# Patient Record
Sex: Female | Born: 1955 | Race: White | Hispanic: No | Smoking: Never smoker
Health system: Southern US, Community
[De-identification: ages and names within clinical notes are randomized; demographics above are authoritative.]

## PROBLEM LIST (undated history)

## (undated) DIAGNOSIS — E663 Overweight: Secondary | ICD-10-CM

## (undated) DIAGNOSIS — M79673 Pain in unspecified foot: Secondary | ICD-10-CM

## (undated) DIAGNOSIS — F329 Major depressive disorder, single episode, unspecified: Secondary | ICD-10-CM

## (undated) DIAGNOSIS — Z86718 Personal history of other venous thrombosis and embolism: Secondary | ICD-10-CM

## (undated) DIAGNOSIS — R32 Unspecified urinary incontinence: Secondary | ICD-10-CM

## (undated) DIAGNOSIS — T7840XA Allergy, unspecified, initial encounter: Secondary | ICD-10-CM

## (undated) DIAGNOSIS — K219 Gastro-esophageal reflux disease without esophagitis: Secondary | ICD-10-CM

## (undated) DIAGNOSIS — K5790 Diverticulosis of intestine, part unspecified, without perforation or abscess without bleeding: Secondary | ICD-10-CM

## (undated) DIAGNOSIS — G4730 Sleep apnea, unspecified: Secondary | ICD-10-CM

## (undated) DIAGNOSIS — M2550 Pain in unspecified joint: Secondary | ICD-10-CM

## (undated) DIAGNOSIS — M549 Dorsalgia, unspecified: Secondary | ICD-10-CM

## (undated) DIAGNOSIS — D126 Benign neoplasm of colon, unspecified: Secondary | ICD-10-CM

## (undated) DIAGNOSIS — F32A Depression, unspecified: Secondary | ICD-10-CM

## (undated) DIAGNOSIS — I10 Essential (primary) hypertension: Secondary | ICD-10-CM

## (undated) DIAGNOSIS — M1990 Unspecified osteoarthritis, unspecified site: Secondary | ICD-10-CM

## (undated) HISTORY — DX: Personal history of other venous thrombosis and embolism: Z86.718

## (undated) HISTORY — PX: TUBAL LIGATION: SHX77

## (undated) HISTORY — PX: OTHER SURGICAL HISTORY: SHX169

## (undated) HISTORY — DX: Overweight: E66.3

## (undated) HISTORY — PX: BREAST REDUCTION SURGERY: SHX8

## (undated) HISTORY — DX: Benign neoplasm of colon, unspecified: D12.6

## (undated) HISTORY — DX: Depression, unspecified: F32.A

## (undated) HISTORY — DX: Unspecified osteoarthritis, unspecified site: M19.90

## (undated) HISTORY — PX: KNEE SURGERY: SHX244

## (undated) HISTORY — DX: Pain in unspecified joint: M25.50

## (undated) HISTORY — DX: Allergy, unspecified, initial encounter: T78.40XA

## (undated) HISTORY — DX: Unspecified urinary incontinence: R32

## (undated) HISTORY — DX: Diverticulosis of intestine, part unspecified, without perforation or abscess without bleeding: K57.90

## (undated) HISTORY — PX: COLONOSCOPY: SHX174

## (undated) HISTORY — DX: Gastro-esophageal reflux disease without esophagitis: K21.9

## (undated) HISTORY — DX: Dorsalgia, unspecified: M54.9

## (undated) HISTORY — DX: Pain in unspecified foot: M79.673

## (undated) HISTORY — PX: WISDOM TOOTH EXTRACTION: SHX21

## (undated) HISTORY — DX: Essential (primary) hypertension: I10

## (undated) HISTORY — PX: REDUCTION MAMMAPLASTY: SUR839

## (undated) HISTORY — PX: GASTRIC BYPASS: SHX52

## (undated) HISTORY — DX: Major depressive disorder, single episode, unspecified: F32.9

---
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
## 2007-10-16 DIAGNOSIS — Z9884 Bariatric surgery status: Secondary | ICD-10-CM

## 2007-10-16 HISTORY — DX: Bariatric surgery status: Z98.84

## 2009-10-15 HISTORY — PX: ABDOMINAL HYSTERECTOMY: SHX81

## 2015-08-09 LAB — HM DEXA SCAN

## 2015-10-16 LAB — HM PAP SMEAR

## 2016-05-03 LAB — HM MAMMOGRAPHY

## 2016-12-17 LAB — HM COLONOSCOPY

## 2016-12-27 LAB — BASIC METABOLIC PANEL
BUN: 15 (ref 4–21)
Creatinine: 0.6 (ref <=1.1)
Glucose: 63
POTASSIUM: 4.3 (ref 3.4–5.3)
SODIUM: 143 (ref 137–147)

## 2016-12-27 LAB — CBC AND DIFFERENTIAL
HEMATOCRIT: 41 (ref 36–46)
HEMOGLOBIN: 13.8 (ref 12.0–16.0)
Neutrophils Absolute: 4
PLATELETS: 266 (ref 150–399)
WBC: 5.5

## 2016-12-27 LAB — VITAMIN D 25 HYDROXY (VIT D DEFICIENCY, FRACTURES): VIT D 25 HYDROXY: 18

## 2016-12-27 LAB — TSH: TSH: 1.53 (ref <=5.90)

## 2016-12-27 LAB — LIPID PANEL
Cholesterol: 193 (ref 0–200)
HDL: 86 — AB (ref 35–70)
LDL CALC: 91
TRIGLYCERIDES: 81 (ref 40–160)

## 2017-01-28 LAB — VITAMIN B12: Vitamin B-12: 450

## 2017-03-20 ENCOUNTER — Encounter: Payer: Self-pay | Admitting: Family Medicine

## 2017-03-25 ENCOUNTER — Ambulatory Visit (INDEPENDENT_AMBULATORY_CARE_PROVIDER_SITE_OTHER): Payer: 59 | Admitting: Family Medicine

## 2017-03-25 ENCOUNTER — Encounter: Payer: Self-pay | Admitting: Family Medicine

## 2017-03-25 VITALS — BP 118/82 | HR 74 | Temp 97.7°F | Ht 59.0 in | Wt 140.0 lb

## 2017-03-25 DIAGNOSIS — Z7689 Persons encountering health services in other specified circumstances: Secondary | ICD-10-CM

## 2017-03-25 DIAGNOSIS — G8929 Other chronic pain: Secondary | ICD-10-CM | POA: Diagnosis not present

## 2017-03-25 DIAGNOSIS — M545 Low back pain: Secondary | ICD-10-CM

## 2017-03-25 DIAGNOSIS — M47816 Spondylosis without myelopathy or radiculopathy, lumbar region: Secondary | ICD-10-CM | POA: Insufficient documentation

## 2017-03-25 NOTE — Patient Instructions (Addendum)
It was a pleasure to meet you today. Please schedule a physical at your convenience.  Also, information for [facility] has been provided for your review.    WE NOW OFFER   Shelly Reynolds[facility] FAST TRACK!!!  SAME DAY Appointments for ACUTE CARE  Such as: Sprains, Injuries, cuts, abrasions, rashes, muscle pain, joint pain, back pain Colds, flu, sore throats, headache, allergies, cough, fever  Ear pain, sinus and eye infections Abdominal pain, nausea, vomiting, diarrhea, upset stomach Animal/insect bites  3 Easy Ways to Schedule: Walk-In Scheduling Call in scheduling Mychart Sign-up: [email or web address]

## 2017-03-25 NOTE — Progress Notes (Signed)
Patient ID: Shelly Reynolds, female   DOB: [birth year], 61 y.o.   MRN: [id number]  Patient presents to clinic today to establish care. She has recently moved here 2 weeks ago from [place]: Low back pain where she has been evaluated and treated  by PT in [place].  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  Pain has improved since she is no longer in this job. BACK PAIN Location: Low back pain Quality: Aching  Worse with: Lifting      Better with: Rest      Radiation: No Trauma: No Best sitting/standing/leaning forward: No   Red Flags Fecal/urinary incontinence: No Numbness/Weakness: No  Fever/chills/sweats: No Night pain:  No Unexplained weight loss: No No relief with bedrest:  No h/o cancer/immunosuppression:  No IV drug use:  No PMH of osteoporosis or chronic steroid use: No  History of gastric bypass and treatment with weight loss medications. She is completing Saxenda [place] for weight loss that has provided great benefit for her. She is interested in adhering to lifestyle modifications and will follow up for evaluation of effectiveness of Saxenda.  She reports approximately 14 lb. Weight loss in the past 6 months. She denies adverse effects of this medication.   Health Maintenance: Dental --Every 6 months Vision --Glasses; every year, UTD Immunizations --No influenza, Needs Tdap; she will consider this with her physical along with  [facility] Colonoscopy -- 2018; no abnormal findings; 10 year follow up Mammogram -- Yearly; she believes this is due soon; will request records PAP --Hysterectomy; Last exam/pap July 2017 Bone Density -- Prior to gastric bypass; no concerns   Past Medical History:  Diagnosis Date  . Arthritis   . Depression   . Eating disorder   . GERD (gastroesophageal reflux disease)   . Hypertension   . Urine incontinence   . UTI (urinary  tract infection)     Past Surgical History:  Procedure Laterality Date  . ABDOMINAL HYSTERECTOMY  2011  . BLADDER REPAIR    . BREAST REDUCTION SURGERY    . GASTRIC BYPASS    . TONSILLECTOMY AND ADENOIDECTOMY Bilateral 1962  . TUBAL LIGATION    . TUMMY TUCK N/A     No current outpatient prescriptions on file prior to visit.   No current facility-administered medications on file prior to visit.     No Known Allergies  Family History  Problem Relation Age of Onset  . Arthritis Mother   . Cancer Mother   . Heart disease Mother   . Diabetes Mother   . Heart disease Father   . Hypertension Father   . Alcohol abuse Brother   . Alcohol abuse Maternal Uncle   . Alcohol abuse Maternal Grandmother     Social History   Social History  . Marital status: Married    Spouse name: N/A  . Number of children: N/A  . Years of education: N/A   Occupational History  . Not on file.   Social History Main Topics  . Smoking status: Never Smoker  . Smokeless tobacco: Never Used  . Alcohol use Yes     Comment: OCCASSIONAL  . Drug use: No  . Sexual activity: Not on file     Comment: HYSTERECTOMY   Other Topics Concern  . Not on file   Social History Narrative  . No narrative on file  Review of Systems  Constitutional: Negative for chills and fever.  Respiratory: Negative for cough, hemoptysis, sputum production, shortness of breath and wheezing.   Cardiovascular: Negative for chest pain, palpitations and leg swelling.  Gastrointestinal: Negative for abdominal pain, blood in stool, constipation, diarrhea, heartburn, melena, nausea and vomiting.  Musculoskeletal: Negative for back pain and myalgias.  Skin: Negative for rash.  Neurological: Negative for dizziness, focal weakness, weakness and headaches.  Psychiatric/Behavioral:       Denies depressed or anxious mood    BP 118/82 (BP Location: Left Arm, Patient Position: Sitting, Cuff Size: Normal)   Pulse 74   Temp 97.7  F (36.5 C) (Oral)   Ht 4\' 11"  (1.499 m)   Wt 140 lb (63.5 kg)   LMP 01/23/2006 (Exact Date)   SpO2 98%   BMI 28.28 kg/m    Past Medical History:  Diagnosis Date  . Arthritis   . Depression   . Eating disorder   . GERD (gastroesophageal reflux disease)   . Hypertension   . Urine incontinence   . UTI (urinary tract infection)      Social History   Social History  . Marital status: Married    Spouse name: N/A  . Number of children: N/A  . Years of education: N/A   Occupational History  . Not on file.   Social History Main Topics  . Smoking status: Never Smoker  . Smokeless tobacco: Never Used  . Alcohol use Yes     Comment: OCCASSIONAL  . Drug use: No  . Sexual activity: Yes     Comment: HYSTERECTOMY   Other Topics Concern  . Not on file   Social History Narrative  . No narrative on file    Past Surgical History:  Procedure Laterality Date  . ABDOMINAL HYSTERECTOMY  2011  . BLADDER REPAIR    . BREAST REDUCTION SURGERY    . GASTRIC BYPASS    . TONSILLECTOMY AND ADENOIDECTOMY Bilateral 1962  . TUBAL LIGATION    . TUMMY TUCK N/A     Family History  Problem Relation Age of Onset  . Arthritis Mother   . Cancer Mother   . Heart disease Mother   . Diabetes Mother   . Heart disease Father   . Hypertension Father   . Alcohol abuse Brother   . Alcohol abuse Maternal Uncle   . Alcohol abuse Maternal Grandmother     No Known Allergies  No current outpatient prescriptions on file prior to visit.   No current facility-administered medications on file prior to visit.     BP 118/82 (BP Location: Left Arm, Patient Position: Sitting, Cuff Size: Normal)   Pulse 74   Temp 97.7 F (36.5 C) (Oral)   Ht 4\' 11"  (1.499 m)   Wt 140 lb (63.5 kg)   LMP 01/23/2006 (Exact Date)   SpO2 98%   BMI 28.28 kg/m     Physical Exam  Constitutional: She is oriented to person, place, and time and well-developed, well-nourished, and in no distress.  Eyes: Pupils are  equal, round, and reactive to light. No scleral icterus.  Neck: Neck supple.  Cardiovascular: Normal rate, regular rhythm and intact distal pulses.   Varicosities on lower extremities bilaterally  Pulmonary/Chest: Effort normal and breath sounds normal. She has no wheezes. She has no rales.  Abdominal: Soft. Bowel sounds are normal. There is no tenderness.  Lymphadenopathy:    She has no cervical adenopathy.  Neurological: She is alert and oriented to  person, place, and time.  Skin: Skin is warm and dry. No rash noted.  Psychiatric: Mood, memory, affect and judgment normal.    Recent Results (from the past 2160 hour(s))  CBC and differential     Status: None   Collection Time: 12/27/16 12:00 AM  Result Value Ref Range   Hemoglobin 13.8 12.0 - 16.0   HCT 41 36 - 46   Neutrophils Absolute 4    Platelets 266 150 - 399   WBC 5.5   VITAMIN D 25 Hydroxy (Vit-D Deficiency, Fractures)     Status: None   Collection Time: 12/27/16 12:00 AM  Result Value Ref Range   Vit D, 25-Hydroxy 18   Basic metabolic panel     Status: None   Collection Time: 12/27/16 12:00 AM  Result Value Ref Range   Glucose 63    BUN 15 4 - 21   Creatinine 0.6 0.5 - 1.1   Potassium 4.3 3.4 - 5.3   Sodium 143 137 - 147  Lipid panel     Status: Abnormal   Collection Time: 12/27/16 12:00 AM  Result Value Ref Range   Triglycerides 81 40 - 160   Cholesterol 193 0 - 200   HDL 86 (A) 35 - 70   LDL Cholesterol 91   TSH     Status: None   Collection Time: 12/27/16 12:00 AM  Result Value Ref Range   TSH 1.53 0.41 - 5.90  Vitamin B12     Status: None   Collection Time: 01/28/17 12:00 AM  Result Value Ref Range   Vitamin B-12 450     Assessment/Plan:  1. Low back pain, unspecified back pain laterality, unspecified chronicity, with sciatica presence unspecified Stable; will consider continuation of PT if symptoms return. She will continue exercises that have been provided by PT. Follow up if symptoms return.  2.  Encounter to establish care We reviewed the PMH, PSH, FH, SH, Meds and Allergies. -We provided refills for any medications we will prescribe as needed. -We addressed current concerns per orders and patient instructions. -We have asked for records for pertinent exams, studies, vaccines and notes from previous providers. -We have advised patient to follow up per instructions below.   -Patient advised to return or notify a provider immediately if symptoms worsen or persist or new concerns arise.  Follow up in 2 to 3 months for physical, lab work, and she will also consider [facility] vaccine at that time.  Delano Metz, FNP-C

## 2017-04-01 ENCOUNTER — Telehealth: Payer: Self-pay

## 2017-04-01 ENCOUNTER — Ambulatory Visit (INDEPENDENT_AMBULATORY_CARE_PROVIDER_SITE_OTHER): Payer: 59 | Admitting: *Deleted

## 2017-04-01 ENCOUNTER — Ambulatory Visit (INDEPENDENT_AMBULATORY_CARE_PROVIDER_SITE_OTHER): Payer: 59 | Admitting: Family Medicine

## 2017-04-01 ENCOUNTER — Encounter: Payer: Self-pay | Admitting: Family Medicine

## 2017-04-01 VITALS — BP 112/78 | HR 79 | Temp 98.1°F | Wt 140.6 lb

## 2017-04-01 DIAGNOSIS — Z23 Encounter for immunization: Secondary | ICD-10-CM

## 2017-04-01 DIAGNOSIS — Z111 Encounter for screening for respiratory tuberculosis: Secondary | ICD-10-CM

## 2017-04-01 MED ORDER — TUBERCULIN PPD 5 UNIT/0.1ML ID SOLN
5.0000 [IU] | Freq: Once | INTRADERMAL | Status: AC
  Administered 2017-04-01: 5 [IU] via INTRADERMAL

## 2017-04-01 NOTE — Progress Notes (Signed)
Patient here for PPD skin test; placed skin test to left forearm ID; patient tolerated well; also administered Tdap to rt deltoid IM; patient tolerated well; patient to return in 48-72 hours for PPD test reading; understanding voiced.

## 2017-04-02 NOTE — Telephone Encounter (Signed)
Error; patient seen as nurse visit; not provider visit

## 2017-04-03 NOTE — Progress Notes (Signed)
Tuberculin skin test applied to left ventral forearm. Explained how to read the test, measuring induration not just erythema; she will come into office if test appears positive.   12mm/negative

## 2017-04-08 NOTE — Progress Notes (Signed)
Patient chart opened in error. Patient completed a nurse visit for Tb skin testing

## 2017-05-06 ENCOUNTER — Telehealth: Payer: Self-pay | Admitting: Family Medicine

## 2017-05-06 NOTE — Telephone Encounter (Signed)
Pt would like to make an appointment for fasting blood work.  May I have a order for labs?

## 2017-05-07 NOTE — Telephone Encounter (Signed)
Spoke with pt scheduled an app for 05/13/2017 at 9.15am.

## 2017-05-13 ENCOUNTER — Encounter: Payer: Self-pay | Admitting: Family Medicine

## 2017-05-13 ENCOUNTER — Ambulatory Visit (INDEPENDENT_AMBULATORY_CARE_PROVIDER_SITE_OTHER): Payer: 59 | Admitting: Family Medicine

## 2017-05-13 VITALS — BP 128/86 | HR 68 | Temp 98.5°F | Wt 145.6 lb

## 2017-05-13 DIAGNOSIS — M545 Low back pain: Secondary | ICD-10-CM | POA: Diagnosis not present

## 2017-05-13 DIAGNOSIS — M25562 Pain in left knee: Secondary | ICD-10-CM

## 2017-05-13 DIAGNOSIS — G8929 Other chronic pain: Secondary | ICD-10-CM | POA: Diagnosis not present

## 2017-05-13 MED ORDER — DICLOFENAC SODIUM 75 MG PO TBEC: 75.0000 mg | DELAYED_RELEASE_TABLET | Freq: Two times a day (BID) | ORAL | 0 refills | Status: DC

## 2017-05-13 NOTE — Patient Instructions (Signed)
It was a pleasure to see you today. Please take medication with food and this medication is for short term use.   You will be contact about your referral.  Please let us know if you have not heard back within 1 week about your referral.  Please continue PT exercises that have been previously prescribed by your provider in [place].

## 2017-05-13 NOTE — Progress Notes (Signed)
Subjective:    Patient ID: Shelly Reynolds, female    DOB: 1956-01-04, 61 y.o.   MRN: [id number]  HPI  Shelly Reynolds is a 61 year old female who presents today for evaluation of low back pain. This has been chronic in nature and she has received PT in [place]. She reports completing PT exercises at home now since she has recently moved full time to [facility]. She states that PT exercises are not improving her discomfort. 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  BACK PAIN  Location: low back pain  Quality: Aching  Worse with:  Bending   Better with: Rest       Radiation: No Trauma: No Best sitting/standing/leaning forward: No  Red Flags Fecal/urinary incontinence: No Numbness/Weakness: No Fever/chills/sweats: No Night pain:  No Unexplained weight loss: No No relief with bedrest:  No h/o cancer/immunosuppression:  No IV drug use:  No PMH of osteoporosis or chronic steroid use: No Treatment with ibuprofen 800 mg TID with food has provided moderate benefit.  History of torn meniscus of left knee with surgical repair over 5 years ago.  Surgery improved knee pain and she reports that knee pain is aggravated when back pain occurs however this is not described as radiating pain. No pain is noted in left knee at this time. No erythema, warmth, or edema noted.   History of gastric bypass and Saxenda [place] for weight loss. She has completed Saxenda and reports an increase in weight of 6 lbs. She is interested in adhering to lifestyle modifications and will further consider pharmaceutical treatments for weight loss.    Review of Systems  Constitutional: Negative for chills, fatigue and fever.  Respiratory: Negative for cough, shortness of breath and wheezing.   Cardiovascular: Negative for chest pain and palpitations.  Gastrointestinal: Negative for abdominal pain,  diarrhea, nausea and vomiting.  Genitourinary: Negative for dysuria, flank pain, frequency and urgency.  Musculoskeletal:       Low back pain and left knee pain  Skin: Negative for rash.  Neurological: Negative for dizziness, weakness, light-headedness, numbness and headaches.   Past Medical History:  Diagnosis Date  . Arthritis   . Depression   . Eating disorder   . GERD (gastroesophageal reflux disease)   . Hypertension   . Urine incontinence   . UTI (urinary tract infection)      Social History   Social History  . Marital status: Married    Spouse name: N/A  . Number of children: N/A  . Years of education: N/A   Occupational History  . Not on file.   Social History Main Topics  . Smoking status: Never Smoker  . Smokeless tobacco: Never Used  . Alcohol use Yes     Comment: OCCASSIONAL  . Drug use: No  . Sexual activity: Yes     Comment: HYSTERECTOMY   Other Topics Concern  . Not on file   Social History Narrative  . No narrative on file    Past Surgical History:  Procedure Laterality Date  . ABDOMINAL HYSTERECTOMY  2011  . BLADDER REPAIR    . BREAST REDUCTION SURGERY    . GASTRIC BYPASS    . TONSILLECTOMY AND ADENOIDECTOMY Bilateral 1962  . TUBAL LIGATION    . TUMMY TUCK N/A     Family History  Problem Relation Age of Onset  . Arthritis  Mother   . Cancer Mother   . Heart disease Mother   . Diabetes Mother   . Heart disease Father   . Hypertension Father   . Alcohol abuse Brother   . Alcohol abuse Maternal Uncle   . Alcohol abuse Maternal Grandmother     No Known Allergies  Current Outpatient Prescriptions on File Prior to Visit  Medication Sig Dispense Refill  . Cyanocobalamin (VITAMIN B-12) 2500 MCG SUBL Place under the tongue.    Marland Kitchen ibuprofen (ADVIL,MOTRIN) 800 MG tablet Take 800 mg by mouth every 8 (eight) hours as needed.    . Liraglutide -Weight Management (SAXENDA [place]) Inject into the skin daily.     No current facility-administered  medications on file prior to visit.     BP 128/86 (BP Location: Left Arm, Patient Position: Sitting, Cuff Size: Normal)   Pulse 68   Temp 98.5 F (36.9 C) (Oral)   Wt 145 lb 9.6 oz (66 kg)   LMP 01/23/2006 (Exact Date)   SpO2 98%   BMI 29.41 kg/m       Objective:   Physical Exam  Constitutional: She is oriented to person, place, and time. She appears well-developed and well-nourished.  Eyes: Pupils are equal, round, and reactive to light. No scleral icterus.  Neck: Neck supple.  Cardiovascular: Normal rate and regular rhythm.   Pulmonary/Chest: Effort normal and breath sounds normal. She has no wheezes. She has no rales.  Abdominal: Soft. Bowel sounds are normal. There is no tenderness. There is no rebound.  Musculoskeletal: She exhibits no edema.  Spine with normal alignment and no deformity. No tenderness to vertebral process with palpation with the exception of mild tenderness around L5.  Paraspinous muscles are tender and patient notes this area as painful. ROM is full at lumbar sacral regions. Negative Straight Leg raise. No CVA tenderness present. Able to heel/toe walk without pain.  Knee: Normal to inspection with no erythema or effusion or obvious bony abnormalities.  Palpation normal with no warmth ROM normal in flexion and extension and lower leg rotation.    Lymphadenopathy:    She has no cervical adenopathy.  Neurological: She is alert and oriented to person, place, and time. Coordination normal.  Skin: Skin is warm and dry. No rash noted.       Assessment & Plan:  1. Chronic low back pain, unspecified back pain laterality, with sciatica presence unspecified Pain remains; PT exercises from provider in [place] are not improving discomfort at this time; we discussed referral to PT for further evaluation and treatment planning however patient declines at this time; we reviewed referral to sports medicine for further evaluation and treatment to determine Lippman term  management of this chronic condition; will provide short term antiinflammatory for discomfort and advised this to be taken with food; we reviewed topical antiinflammatory medication however patient is concerned regarding cost and insurance coverage for this type of medication.  - Ambulatory referral to Family Practice - diclofenac (VOLTAREN) 75 MG EC tablet; Take 1 tablet (75 mg total) by mouth 2 (two) times daily.  Dispense: 30 tablet; Refill: 0  2. Left knee pain, unspecified chronicity Stable today; no pain present; referral to sports medicine for evaluation of knee and chronic low back pain regarding [place] term management as patient has declined PT referral at this time but is interested in opinion from sports medicine. - Ambulatory referral to [facility] follow up for physical and lab work which is due.  Delano Metz,  FNP-C

## 2017-05-15 ENCOUNTER — Other Ambulatory Visit: Payer: Self-pay

## 2017-05-15 ENCOUNTER — Telehealth: Payer: Self-pay | Admitting: Family Medicine

## 2017-05-15 NOTE — Telephone Encounter (Signed)
A referral to Medical weight loss management with Dr. Dennard Nip can be placed for evaluation and treatment for obesity. Patient has recently moved from [place] and has discontinued Saxenda and would like to continue treatment.

## 2017-05-15 NOTE — Telephone Encounter (Signed)
Hilda Blades helped me place in the referral for the medical weight loss. I called pt and is aware that the referral was placed and she is ok with the decision.

## 2017-05-21 ENCOUNTER — Telehealth: Payer: Self-pay | Admitting: Family Medicine

## 2017-05-21 ENCOUNTER — Ambulatory Visit (INDEPENDENT_AMBULATORY_CARE_PROVIDER_SITE_OTHER): Payer: 59

## 2017-05-21 ENCOUNTER — Ambulatory Visit (INDEPENDENT_AMBULATORY_CARE_PROVIDER_SITE_OTHER): Payer: 59 | Admitting: Sports Medicine

## 2017-05-21 ENCOUNTER — Encounter: Payer: Self-pay | Admitting: Sports Medicine

## 2017-05-21 ENCOUNTER — Ambulatory Visit: Payer: Self-pay

## 2017-05-21 VITALS — BP 144/98 | HR 78 | Ht 59.0 in | Wt 147.8 lb

## 2017-05-21 DIAGNOSIS — M545 Low back pain: Secondary | ICD-10-CM

## 2017-05-21 DIAGNOSIS — M47816 Spondylosis without myelopathy or radiculopathy, lumbar region: Secondary | ICD-10-CM

## 2017-05-21 DIAGNOSIS — M25562 Pain in left knee: Secondary | ICD-10-CM

## 2017-05-21 DIAGNOSIS — G8929 Other chronic pain: Secondary | ICD-10-CM

## 2017-05-21 DIAGNOSIS — Z9884 Bariatric surgery status: Secondary | ICD-10-CM | POA: Diagnosis not present

## 2017-05-21 NOTE — Assessment & Plan Note (Addendum)
Patient does have some underlying degenerative changes.  Discussed the importance of continuing to work on leg strengthening.  Injection today as below.  She should avoid anti-inflammatories due to her prior gastric bypass we did discuss this today.  Could consider topical NSAID as an alternative if any lack of improvement with the injection but I would like to avoid NSAIDs altogether if possible.  Can consider Visco supplementation and information regarding this was provided.  We will plan to follow-up with her in 2 weeks for consideration of repeat injections versus Visco.

## 2017-05-21 NOTE — Progress Notes (Signed)
OFFICE VISIT NOTE Shelly Reynolds. [name], [place] at [place] - 61 y.o. female MRN [id number]  Date of birth: 1956-01-29  Visit Date: 05/21/2017  PCP: Delano Metz, FNP   Referred by: Delano Metz, [place], CMA acting as scribe for Dr. Paulla Fore.  SUBJECTIVE:   Chief Complaint  Patient presents with  . New Patient (Initial Visit)    low back pain/ left knee pain    HPI: As below and per problem based documentation when appropriate.  Patient presents today with low pack pain and left knee pain  Started 3 years ago with the knee pain and low back pain 10 years   Her left knee has had issues for quite some time but is worsened over the past several months.  She has had prior gastric bypass surgery and is taking ibuprofen 800 mg on a fairly regular basis.  No adverse effects denies any melena or hematochezia. The pain is described as throbbing at a rate of 4/10 at this moment  Worsened with standing, walking, daily activity  Improves with being off feet  Therapies tried include physical therapy   Other associated symptoms include: patient states she walks 3 miles a day with no improvement. Usually makes it worse but she is gaining weight and with the weight gain the pain worsens.   She reports her back pain seems to be worse first thing in the morning and she works on perform physical therapy exercises in bed she is able to loosen up to the point that she is unable to get out of bed and continue with exercises and soon is able to go about her normal day with constant pain in the low back does not radiate.  She denies any bowel or bladder changes.  No significant nighttime awakenings due to this issue.    Review of Systems  Constitutional: Negative for chills, fever and malaise/fatigue.  Respiratory: Negative for shortness of breath and wheezing.   Cardiovascular: Negative for chest pain and  palpitations.  Neurological: Negative for dizziness, weakness and headaches.    Otherwise per HPI.  HISTORY & PERTINENT PRIOR DATA:  No specialty comments available. She reports that she has never smoked. She has never used smokeless tobacco. No results for input(s): HGBA1C, LABURIC in the last 8760 hours. Medications & Allergies reviewed per EMR Patient Active Problem List   Diagnosis Date Noted  . Chronic pain of left knee 05/21/2017  . S/P gastric bypass 05/21/2017  . 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 Arthritis   . Depression   . Eating disorder   . GERD (gastroesophageal reflux disease)   . Hypertension   . Urine incontinence   . UTI (urinary tract infection)    Family History  Problem Relation Age of Onset  . Arthritis Mother   . Cancer Mother   . Heart disease Mother   . Diabetes Mother   . Heart disease Father   . Hypertension Father   . Alcohol abuse Brother   . Alcohol abuse Maternal Uncle   . Alcohol abuse Maternal Grandmother    Past Surgical History:  Procedure Laterality Date  . ABDOMINAL HYSTERECTOMY  2011  . BLADDER REPAIR    . BREAST REDUCTION SURGERY    . GASTRIC BYPASS    . TONSILLECTOMY AND ADENOIDECTOMY Bilateral 1962  . TUBAL LIGATION    . TUMMY TUCK  N/A    Social History   Occupational History  . Not on file.   Social History Main Topics  . Smoking status: Never Smoker  . Smokeless tobacco: Never Used  . Alcohol use Yes     Comment: OCCASSIONAL  . Drug use: No  . Sexual activity: Yes     Comment: HYSTERECTOMY    OBJECTIVE:  VS:  HT:4\' 11"  (149.9 cm)   WT:147 lb 12.8 oz (67 kg)  BMI:29.9    BP:(!) 144/98  HR:78bpm  TEMP: ( )  RESP:97 % EXAM: Findings:  WDWN, NAD, Non-toxic appearing Alert & appropriately interactive Not depressed or anxious appearing No increased work of breathing. Pupils are equal. EOM intact without nystagmus No clubbing or cyanosis of the extremities  appreciated No significant rashes/lesions/ulcerations overlying the examined area. DP & PT pulses 2+/4.  No significant pretibial edema. Sensation intact to light touch in lower extremities.  Back: Overall well aligned.  No significant scoliosis.  She has pain with palpation of L4-5 facet joints bilaterally.  She has pain with extension.  Pain is slightly improved with flexion.  She has a negative straight leg raise.  Lower extremity sensation is intact light touch.  Lower extremity strength is 5+/5 in all myotomes.  She has good internal/external rotation of bilateral hips.  Left knee: Overall she is slight genu valgus deformity but this is minimal.  Slightly lateral riding patella.  Positive J sign.  Poor VMO definition.  Stable ligamentously with 2-3 mm of opening bilaterally with solid endpoints with valgus testing.  Anterior posterior drawer stable bilaterally.  Minimal pain with McMurray's testing with no reproducible click.     Dg Lumbar Spine 2-3 Views  Result Date: 05/21/2017 CLINICAL DATA:  Low back pain for many years EXAM: LUMBAR SPINE - 2-3 VIEW COMPARISON:  None. FINDINGS: The lumbar vertebrae are in normal alignment. There is degenerative disc disease at both L4-5 and L5-S1 as well as at T11-T12 with loss of disc space and sclerosis with spurring. No acute compression deformity is seen. The SI joints are unremarkable. The bowel gas pattern is nonspecific. Sutures are noted in the left abdomen. IMPRESSION: 1. Degenerative disc disease at L5-S1, L4-5, and T11-12. Normal alignment. 2. No fracture. Electronically Signed   By: Ivar Drape M.D.   On: 05/21/2017 10:04   Dg Knee Ap/lat W/sunrise Left  Result Date: 05/21/2017 CLINICAL DATA:  Low back pain, left knee pain for years with history of previously torn meniscus EXAM: LEFT KNEE 3 VIEWS COMPARISON:  None. FINDINGS: There is primarily bicompartmental degenerative joint disease of the left knee involving the medial and patellofemoral  compartments with some loss of joint space and spurring. The left lateral compartment is relatively well preserved. No fracture is seen. No joint effusion is noted. The patella on the left is slightly laterally positioned within the patellofemoral groove. IMPRESSION: 1. Bicompartmental degenerative joint disease of the left knee. No acute abnormality. 2. The patella is slightly laterally positioned on the left. Electronically Signed   By: Ivar Drape M.D.   On: 05/21/2017 10:03   ASSESSMENT & PLAN:     ICD-10-CM   1. Chronic low back pain, unspecified back pain laterality, with sciatica presence unspecified M54.5 DG Lumbar Spine 2-3 Views   G89.29 MR Lumbar Spine Wo Contrast  2. Chronic pain of left knee M25.562 DG Knee AP/LAT W/Sunrise Left   G89.29 US GUIDED NEEDLE PLACEMENT(NO LINKED CHARGES)    Ambulatory referral to Physical Medicine Rehab  3.  Spondylosis of lumbar region without myelopathy or radiculopathy M47.816   4. S/P gastric bypass Z98.84   ================================================================= Chronic pain of left knee Patient does have some underlying degenerative changes.  Discussed the importance of continuing to work on leg strengthening.  Injection today as below.  She should avoid anti-inflammatories due to her prior gastric bypass we did discuss this today.  Could consider topical NSAID as an alternative if any lack of improvement with the injection but I would like to avoid NSAIDs altogether if possible.  Can consider Visco supplementation and information regarding this was provided.  We will plan to follow-up with her in 2 weeks for consideration of repeat injections versus Visco.  Spondylosis of lumbar joint Symptoms do seem to be most consistent with facet mediated joint pain.  I suspect that the underlying axial back pain is related to degenerative changes that would likely respond well to direct intervention to the facets including medial branch blocks and  consideration of radiofrequency ablation.  We will begin characterizing the lumbar spine better with an MRI and will plan to refer her to Dr. Laurence Spates for consideration of medial branch blocks versus RFA.  S/P gastric bypass Patient should be avoiding anti-inflammatories given her prior history.  We did discuss that since stopping her Saxenda she is continued to have progressive weight gain.  Discussed the options for obesity medicine referral and message sent to PCP to further address this.  Once again I would like to see her trying to avoid anti-inflammatories given the risk of ulceration patient is aware of the contraindication. ================================================================= ================================================================= Future Appointments Date Time Provider [place]  07/30/2017 3:40 PM Gerda Diss, DO [facility] None    Follow-up: Return in about 10 weeks (around 07/30/2017).   CMA/ATC served as Education administrator during this visit. History, Physical, and Plan performed by medical provider. Documentation and orders reviewed and attested to.      Teresa Coombs, [place] [facility] Physician

## 2017-05-21 NOTE — Assessment & Plan Note (Signed)
Patient should be avoiding anti-inflammatories given her prior history.  We did discuss that since stopping her Saxenda she is continued to have progressive weight gain.  Discussed the options for obesity medicine referral and message sent to PCP to further address this.  Once again I would like to see her trying to avoid anti-inflammatories given the risk of ulceration patient is aware of the contraindication.

## 2017-05-21 NOTE — Telephone Encounter (Signed)
Patient was referred on 05/15/17 to Dr. Dennard Nip for weight management. There is also another option of Dr. Briscoe Deutscher at [facility] who treats patients and this may be an option that can be accessed sooner if she is interested. Either provider is excellent, patient should choose.

## 2017-05-21 NOTE — Patient Instructions (Addendum)
You had an injection today.  Things to be aware of after injection are listed below: . You may experience no significant improvement or even a slight worsening in your symptoms during the first 24 to 48 hours.  After that we expect your symptoms to improve gradually over the next 2 weeks for the medicine to have its maximal effect.  You should continue to have improvement out to 6 weeks after your injection. . Dr. [name] recommends icing the site of the injection for 20 minutes  1-2 times the day of your injection . You may shower but no swimming, tub bath or Jacuzzi for 24 hours. . If your bandage falls off this does not need to be replaced.  It is appropriate to remove the bandage after 4 hours. . You may resume light activities as tolerated unless otherwise directed per Dr. [name] during your visit  POSSIBLE STEROID SIDE EFFECTS:  Side effects from injectable steroids tend to be less than when taken orally however you may experience some of the symptoms listed below.  If experienced these should only last for a short period of time. Change in menstrual flow  Edema (swelling)  Increased appetite Skin flushing (redness)  Skin rash/acne  Thrush (oral) Yeast vaginitis    Increased sweating  Depression Increased blood glucose levels Cramping and leg/calf  Euphoria (feeling happy)  POSSIBLE PROCEDURE SIDE EFFECTS: The side effects of the injection are usually fairly minimal however if you may experience some of the following side effects that are usually self-limited and will is off on their own.  If you are concerned please feel free to call the office with questions:  Increased numbness or tingling  Nausea or vomiting  Swelling or bruising at the injection site   Please call our office if if you experience any of the following symptoms over the next week as these can be signs of infection:   Fever greater than 100.5F  Significant swelling at the injection site  Significant redness or drainage  from the injection site  If after 2 weeks you are continuing to have worsening symptoms please call our office to discuss what the next appropriate actions should be including the potential for a return office visit or other diagnostic testing.   We are ordering an MRI for you today.  The imaging office will be calling you to schedule your appointment after we obtain authorization from your insurance company.   Please be sure you have signed up for MyChart so that we can get your results to you.  We will be in touch with you as soon as we can.  Please know, it can take up to 3-4 business days for the radiologist and Dr. [name] to have time to review the results and determine the best appropriate action.  If there is something that appears to be surgical or needs a referral to other specialists we will let you know through MyChart or telephone.  Otherwise we will plan to schedule a follow up appointment with Dr. [name] once we have the results.   

## 2017-05-21 NOTE — Assessment & Plan Note (Signed)
Symptoms do seem to be most consistent with facet mediated joint pain.  I suspect that the underlying axial back pain is related to degenerative changes that would likely respond well to direct intervention to the facets including medial branch blocks and consideration of radiofrequency ablation.  We will begin characterizing the lumbar spine better with an MRI and will plan to refer her to Dr. Laurence Spates for consideration of medial branch blocks versus RFA.

## 2017-05-21 NOTE — Procedures (Signed)
PROCEDURE NOTE - ULTRASOUND GUIDED NJECTION: Left Knee Images were obtained and interpreted by myself, Teresa Coombs, DO  Images have been saved and stored to PACS system. Images obtained on: GE S7 Ultrasound machine  ULTRASOUND FINDINGS:  Patella & Patellar Tendon: Normal Quad & Quad Tendon: Normal Suprapatellar Pouch: Normal, no effusion, minimal synovitis Medial Joint Line: Degenerative spurring appreciated.  Bulging medial meniscus Lateral Joint Line: Degenerative spurring appreciated, bulging lateral meniscus Trochlea: Normal appearing cartilage Posterior knee: No appreciable Baker's cyst   DESCRIPTION OF PROCEDURE:  The patient's clinical condition is marked by substantial pain and/or significant functional disability. Other conservative therapy has not provided relief, is contraindicated, or not appropriate. There is a reasonable likelihood that injection will significantly improve the patient's pain and/or functional impairment. After discussing the risks, benefits and expected outcomes of the injection and all questions were reviewed and answered, the patient wished to undergo the above named procedure. Verbal consent was obtained. The ultrasound was used to identify the target structure and adjacent neurovascular structures. The skin was then prepped in sterile fashion and the target structure was injected under direct visualization using sterile technique as below: PREP: Alcohol, Ethel Chloride APPROACH: Superiolateral, single injection, 21g 2" needle INJECTATE: 2cc 1% lidocaine, 2cc 40mg  DepoMedrol ASPIRATE: N/A DRESSING: Band-Aid  Post procedural instructions including recommending icing and warning signs for infection were reviewed. This procedure was well tolerated and there were no complications.   IMPRESSION: Succesful US Guided Injection

## 2017-05-22 ENCOUNTER — Other Ambulatory Visit: Payer: Self-pay

## 2017-05-23 ENCOUNTER — Telehealth: Payer: Self-pay | Admitting: Family Medicine

## 2017-05-23 NOTE — Telephone Encounter (Signed)
Please Advise

## 2017-05-23 NOTE — Telephone Encounter (Signed)
Pt calling stating that she can not afford the [employer] and would like to see if Gregary Signs can refer her to something else for weight loss.

## 2017-05-23 NOTE — Telephone Encounter (Signed)
Spoke with pt stated that she has not received a call from the weight  Management clinic.Spoke with Hilda Blades regarding to the referral and she stated that there was a waiting list since Dr Leafy Ro is the only one who handles all referrals. Hilda Blades stated that patient did not need another referral with another doctor since all weight management referrals have to go through dr Leafy Ro, [facility] with patient and advised her to call the weight management clinic to see when she will be scheduled for am appointment.

## 2017-05-24 NOTE — Telephone Encounter (Signed)
Recommend that she see Dr. Briscoe Deutscher at [facility] She also has a degree in nutrition and works with patients regarding nutrition and weight loss since Ms. Sciarra has a history of being treated for weight loss from [place]. If she needs anything from this office, we can provide a referral.

## 2017-05-26 ENCOUNTER — Ambulatory Visit
Admission: RE | Admit: 2017-05-26 | Discharge: 2017-05-26 | Disposition: A | Discharge status: Home / Self Care | Payer: 59 | Source: Ambulatory Visit | Attending: Sports Medicine | Admitting: Sports Medicine

## 2017-05-26 DIAGNOSIS — M545 Low back pain: Principal | ICD-10-CM

## 2017-05-26 DIAGNOSIS — G8929 Other chronic pain: Secondary | ICD-10-CM

## 2017-05-28 NOTE — Telephone Encounter (Signed)
Attempted to call pt, left a message to return my call in the office.

## 2017-05-29 ENCOUNTER — Telehealth: Payer: Self-pay | Admitting: Family Medicine

## 2017-05-29 NOTE — Telephone Encounter (Signed)
Fine with me

## 2017-05-29 NOTE — Telephone Encounter (Signed)
Spoke with pt voiced understanding that if she wishes to establish care with dr Juleen China who works with patients with nutrition and weight loss management she just need to call [place] office and schedule an appointment to establish with her, pt stated that she is  willing to if dr Juleen China helps to manages her weight loss. Pt was provided with contact  information to the practice.

## 2017-05-29 NOTE — Telephone Encounter (Signed)
Patient wants to switch PCP from NP Delano Metz of [place] to Dr. Briscoe Deutscher of [place].  Please respond at your earliest convenience to acknowledge the patient's request.   Thank you,  -LL

## 2017-05-30 NOTE — Telephone Encounter (Signed)
Okay 

## 2017-06-04 NOTE — Telephone Encounter (Signed)
Noted  

## 2017-06-07 ENCOUNTER — Other Ambulatory Visit: Payer: Self-pay

## 2017-06-07 DIAGNOSIS — M47816 Spondylosis without myelopathy or radiculopathy, lumbar region: Secondary | ICD-10-CM

## 2017-06-12 ENCOUNTER — Ambulatory Visit (INDEPENDENT_AMBULATORY_CARE_PROVIDER_SITE_OTHER): Payer: 59 | Admitting: Physical Medicine and Rehabilitation

## 2017-06-12 ENCOUNTER — Encounter (INDEPENDENT_AMBULATORY_CARE_PROVIDER_SITE_OTHER): Payer: Self-pay | Admitting: Physical Medicine and Rehabilitation

## 2017-06-12 VITALS — BP 137/88 | HR 65

## 2017-06-12 DIAGNOSIS — M5416 Radiculopathy, lumbar region: Secondary | ICD-10-CM

## 2017-06-12 NOTE — Progress Notes (Deleted)
Pain across lower back. Rarely radiates into legs. Numbness in right big toe for years. Pain is worse when first getting up in the mornings or if on her feet a lot during the day.

## 2017-06-13 ENCOUNTER — Ambulatory Visit: Payer: 59 | Admitting: Family Medicine

## 2017-06-19 ENCOUNTER — Encounter (INDEPENDENT_AMBULATORY_CARE_PROVIDER_SITE_OTHER): Payer: Self-pay | Admitting: Physical Medicine and Rehabilitation

## 2017-06-19 NOTE — Progress Notes (Signed)
Shelly Reynolds - 61 y.o. female MRN [id number]  Date of birth: 02-18-1956  Office Visit Note: Visit Date: 06/12/2017 PCP: Delano Metz, FNP Referred by: Delano Metz, FNP  Subjective: Chief Complaint  Patient presents with  . Lower Back - Pain   HPI: Shelly Reynolds is a very pleasant 61 year old female who reports over 10 years of chronic low back pain. She reports a lot of history with prior work that required her moving and handling Shelly Reynolds with developmental problems etc. She reports years of doing this has given her quite a bit of back pain off and on. She has had physical therapy in [place] where she moved from. She recently saw Dr. Teresa Coombs. She is having knee pain and back pain at the time. According to the patient and his notes she was having mostly axial low back pain without any referral pattern. She has failed conservative care with medications and physical therapy. She's had gastric bypass surgery and has to avoid nonsteroidal anti-inflammatories although she will take ibuprofen at times. She has endorsed some weight gain. She feels like if she's not exercising and not able to exercise and she gains weight and this makes her back hurt. Dr. Paulla Fore did obtain an MRI of the lumbar spine and this is reviewed below. His thought process was that this was maybe more facet mediated pain and she might be a candidate for radiofrequency ablation. She describes her current pain is pain across the lower back that radiates into the legs at times but rarely. She also has numbness in the right big toe for years. This would be a fairly classic L5 distribution. She isn't getting true pain all the way down the leg to the toe however. She feels like her pain is worse first thing in the morning where she is on her feet a lot during the day. She has to do some exercises in bed just to get going. She's had no history of trauma to the lumbar spine at least acutely. She's had no history of lumbar  surgery.    ROS Otherwise per HPI.  Assessment & Plan: Visit Diagnoses:  1. Lumbar radiculopathy     Plan: Findings:  Chronic many year history of low back pain which I think is a combination of mechanical symptoms and facet arthropathy but also likely disc related as well. She is having more right-sided low back pain. She made had more disc issues in the past. She has chronic numbness in an L5 distribution in the right foot and toe. Some of her symptoms I think may be related to the disc at L5-S1 even though it is a little bit more left paracentral more broad. I think the best approach given the fact that she has failed conservative care at this point to be a diagnostic and hopefully therapeutic right L5 transforaminal epidural steroid injection. Depending on how she does with that I would look at medial branch blocks of the lower facets. We had a Ackert discussion about this today and she does want to try this. I think continue to work with Dr. Paulla Fore will be beneficial from an osteopathic medicine standpoint as well as continue to stay active and continue to do physical therapy exercises and maybe regrouping with physical therapist with continued core strengthening. We'll see how she does first with the epidural injection. I did review the images with her the report findings were correct. There is a small grade 1 listhesis of L4 on L5 but in  the results or impression part it reason there is a grade 1 listhesis of L5 on S1 which is not accurate. She also has quite a bit of atrophy of the multifidus the lower lumbar segments.    Meds & Orders: No orders of the defined types were placed in this encounter.  No orders of the defined types were placed in this encounter.   Follow-up: Return for Right L5 transforaminal epidural steroid injection.   Procedures: No procedures performed  No notes on file   Clinical History: MRI LUMBAR SPINE WITHOUT CONTRAST  TECHNIQUE: Multiplanar, multisequence MR  imaging of the lumbar spine was performed. No intravenous contrast was administered.  COMPARISON:  None.  FINDINGS: Segmentation:  Standard.  Alignment:  L4-5 minimal grade 1 anterolisthesis.  Vertebrae:  No fracture, evidence of discitis, or bone lesion.  Conus medullaris: Extends to the L2 level and appears normal.  Paraspinal and other soft tissues: Negative.  Disc levels:  L1-2: Minimal disc bulge. No significant foraminal or canal stenosis.  L2-3: Minimal disc bulge. No significant foraminal or canal stenosis.  L3-4: Small disc bulge with mild facet and ligamentum flavum hypertrophy. Mild bilateral foraminal and lateral recess stenosis. No significant canal stenosis.  L4-5: Grade 1 anterolisthesis with small uncovered disc bulge combined with moderate facet and ligamentum flavum hypertrophy eccentric to the left. Mild bilateral foraminal stenosis. No significant canal stenosis. Disc contact on descending left L5 nerve root in left lateral recess.  L5-S1: Moderate disc bulge, endplate marginal osteophytes, mild facet, and moderate ligamentum flavum hypertrophy. Moderate bilateral foraminal stenosis. Bilateral recess effacement with disc contact on descending S1 nerve roots.  IMPRESSION: 1. L5-S1 minimal grade 1 anterolisthesis. 2. Lumbar degenerative changes with disc and prominent lower lumbar facet arthrosis. 3. No significant canal stenosis.  No acute osseous abnormality. 4. Mild bilateral L3-4 and L4-5 foraminal stenosis and moderate bilateral L5-S1 foraminal stenosis.   Electronically Signed   By: Kristine Garbe M.D.   On: 05/26/2017 22:16  She reports that she has never smoked. She has never used smokeless tobacco. No results for input(s): HGBA1C, LABURIC in the last 8760 hours.  Objective:  VS:  HT:    WT:   BMI:     BP:137/88  HR:65bpm  TEMP: ( )  RESP:  Physical Exam  Constitutional: She is oriented to person, place,  and time. She appears well-developed and well-nourished. No distress.  Obese  HENT:  Head: Normocephalic and atraumatic.  Nose: Nose normal.  Mouth/Throat: Oropharynx is clear and moist.  Eyes: Pupils are equal, round, and reactive to light. Conjunctivae are normal.  Neck: Normal range of motion. Neck supple.  Cardiovascular: Regular rhythm and intact distal pulses.   Pulmonary/Chest: Effort normal. No respiratory distress.  Abdominal: She exhibits no distension. There is no guarding.  Musculoskeletal:  Patient ambulates without aid with a slightly antalgic gait to the left. She does have pain with extension rotation of the lumbar spine which reproduces a lot of her back pain. She has no pain over the greater trochanters and no pain with hip rotation. She has no real focal trigger points but she does have paraspinal-type muscles without overt spasm. She has good distal strength bilaterally without any deficits. There is no clonus bilaterally. She has an equivocally positive slump test to the right.  Neurological: She is alert and oriented to person, place, and time. She exhibits normal muscle tone. Coordination normal.  Skin: Skin is warm. No rash noted. No erythema.  Psychiatric: She has  a normal mood and affect. Her behavior is normal.  Nursing note and vitals reviewed.   Ortho Exam Imaging: No results found.  Past Medical/Family/Surgical/Social History: Medications & Allergies reviewed per EMR Patient Active Problem List   Diagnosis Date Noted  . Chronic pain of left knee 05/21/2017  . S/P gastric bypass 05/21/2017  . 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 Arthritis   . Depression   . Eating disorder   . GERD (gastroesophageal reflux disease)   . Hypertension   . Urine incontinence   . UTI (urinary tract infection)    Family History  Problem Relation Age of Onset  . Arthritis Mother   . Cancer Mother   . Heart disease Mother     . Diabetes Mother   . Heart disease Father   . Hypertension Father   . Alcohol abuse Brother   . Alcohol abuse Maternal Uncle   . Alcohol abuse Maternal Grandmother    Past Surgical History:  Procedure Laterality Date  . ABDOMINAL HYSTERECTOMY  2011  . BLADDER REPAIR    . BREAST REDUCTION SURGERY    . GASTRIC BYPASS    . TONSILLECTOMY AND ADENOIDECTOMY Bilateral 1962  . TUBAL LIGATION    . TUMMY TUCK N/A    Social History   Occupational History  . Not on file.   Social History Main Topics  . Smoking status: Never Smoker  . Smokeless tobacco: Never Used  . Alcohol use Yes     Comment: OCCASSIONAL  . Drug use: No  . Sexual activity: Yes     Comment: HYSTERECTOMY

## 2017-06-24 ENCOUNTER — Ambulatory Visit (INDEPENDENT_AMBULATORY_CARE_PROVIDER_SITE_OTHER): Payer: Self-pay

## 2017-06-24 ENCOUNTER — Encounter (INDEPENDENT_AMBULATORY_CARE_PROVIDER_SITE_OTHER): Payer: Self-pay | Admitting: Physical Medicine and Rehabilitation

## 2017-06-24 ENCOUNTER — Ambulatory Visit (INDEPENDENT_AMBULATORY_CARE_PROVIDER_SITE_OTHER): Payer: 59 | Admitting: Physical Medicine and Rehabilitation

## 2017-06-24 VITALS — BP 125/86 | HR 72 | Temp 98.0°F

## 2017-06-24 DIAGNOSIS — M5416 Radiculopathy, lumbar region: Secondary | ICD-10-CM

## 2017-06-24 MED ORDER — LIDOCAINE HCL (PF) 1 % IJ SOLN
2.0000 mL | Freq: Once | INTRAMUSCULAR | Status: AC
  Administered 2017-06-24: 2 mL

## 2017-06-24 MED ORDER — BETAMETHASONE SOD PHOS & ACET 6 (3-3) MG/ML IJ SUSP
12.0000 mg | Freq: Once | INTRAMUSCULAR | Status: AC
  Administered 2017-06-24: 12 mg

## 2017-06-24 NOTE — Progress Notes (Deleted)
Patient is here today for planned right L5 transforaminal injection. No change in symptoms.

## 2017-06-24 NOTE — Patient Instructions (Signed)

## 2017-06-26 NOTE — Procedures (Signed)
Shelly Reynolds is a 61 year old female with low back pain right more than left with some L5 symptoms. She comes in for planned right L5 transforaminal epidural steroid injection. Please see our prior evaluation and management note for further details and justification.  Lumbosacral Transforaminal Epidural Steroid Injection - Sub-Pedicular Approach with Fluoroscopic Guidance  Patient: Shelly Reynolds      Date of Birth: 11-06-1955 MRN: [id number] PCP: Delano Metz, FNP      Visit Date: 06/24/2017   Universal Protocol:    Date/Time: 06/24/2017  Consent Given By: the patient  Position: PRONE  Additional Comments: Vital signs were monitored before and after the procedure. Patient was prepped and draped in the usual sterile fashion. The correct patient, procedure, and site was verified.   Injection Procedure Details:  Procedure Site One Meds Administered:  Meds ordered this encounter  Medications  . lidocaine (PF) (XYLOCAINE) 1 % injection 2 mL  . betamethasone acetate-betamethasone sodium phosphate (CELESTONE) injection 12 mg    Laterality: Right  Location/Site:  L5-S1  Needle size: 22 G  Needle type: Spinal  Needle Placement: Transforaminal  Findings:  -Contrast Used: 0.5 mL iohexol 180 mg iodine/mL   -Comments: Excellent flow of contrast along the nerve and into the epidural space.  Procedure Details: After squaring off the end-plates to get a true AP view, the C-arm was positioned so that an oblique view of the foramen as noted above was visualized. The target area is just inferior to the "nose of the scotty dog" or sub pedicular. The soft tissues overlying this structure were infiltrated with 2-3 ml. of 1% Lidocaine without Epinephrine.  The spinal needle was inserted toward the target using a "trajectory" view along the fluoroscope beam.  Under AP and lateral visualization, the needle was advanced so it did not puncture dura and was located close the 6 O'Clock  position of the pedical in AP tracterory. Biplanar projections were used to confirm position. Aspiration was confirmed to be negative for CSF and/or blood. A 1-2 ml. volume of Isovue-250 was injected and flow of contrast was noted at each level. Radiographs were obtained for documentation purposes.   After attaining the desired flow of contrast documented above, a 0.5 to 1.0 ml test dose of 0.25% Marcaine was injected into each respective transforaminal space.  The patient was observed for 90 seconds post injection.  After no sensory deficits were reported, and normal lower extremity motor function was noted,   the above injectate was administered so that equal amounts of the injectate were placed at each foramen (level) into the transforaminal epidural space.   Additional Comments:  The patient tolerated the procedure well Dressing: Band-Aid    Post-procedure details: Patient was observed during the procedure. Post-procedure instructions were reviewed.  Patient left the clinic in stable condition.

## 2017-07-05 ENCOUNTER — Encounter: Payer: Self-pay | Admitting: Family Medicine

## 2017-07-05 ENCOUNTER — Ambulatory Visit (INDEPENDENT_AMBULATORY_CARE_PROVIDER_SITE_OTHER): Payer: 59 | Admitting: Family Medicine

## 2017-07-05 DIAGNOSIS — Z79899 Other long term (current) drug therapy: Secondary | ICD-10-CM

## 2017-07-05 DIAGNOSIS — Z1231 Encounter for screening mammogram for malignant neoplasm of breast: Secondary | ICD-10-CM | POA: Diagnosis not present

## 2017-07-05 MED ORDER — TOPIRAMATE 50 MG PO TABS: 50.0000 mg | ORAL_TABLET | Freq: Two times a day (BID) | ORAL | 0 refills | Status: DC

## 2017-07-05 MED ORDER — PHENTERMINE HCL 37.5 MG PO TABS: 37.5000 mg | ORAL_TABLET | Freq: Every day | ORAL | 0 refills | Status: DC

## 2017-07-05 NOTE — Progress Notes (Signed)
Shelly Reynolds is a 61 y.o. female is here to [place].   Patient Care Team: Briscoe Deutscher, DO as PCP - General (Family Medicine)   History of Present Illness:   Shelly Reynolds, CMA, acting as scribe for Dr. Juleen China.  HPI:  Patient comes in today to establish care.  She states she would like to discuss starting a medication for weight loss.    Usual eating pattern includes: The patient eats a regular, healthy diet. The patient snacks frequently..  Wt Readings from Last 3 Encounters:  07/05/17 149 lb 12.8 oz (67.9 kg)  05/21/17 147 lb 12.8 oz (67 kg)  05/13/17 145 lb 9.6 oz (66 kg)   Health Maintenance Due  Topic Date Due  . Hepatitis C Screening  10/26/1955  . HIV Screening  06/28/1972  . MAMMOGRAM  05/03/2017   PMHx, SurgHx, SocialHx, Medications, and Allergies were reviewed in the Visit Navigator and updated as appropriate.   Past Medical History:  Diagnosis Date  . Arthritis   . Depression   . GERD (gastroesophageal reflux disease)   . Hypertension   . Urine incontinence    Past Surgical History:  Procedure Laterality Date  . ABDOMINAL HYSTERECTOMY  2011  . BLADDER REPAIR    . BREAST REDUCTION SURGERY    . GASTRIC BYPASS    . TONSILLECTOMY AND ADENOIDECTOMY Bilateral 1962  . TUBAL LIGATION    . TUMMY TUCK N/A    Family History  Problem Relation Age of Onset  . Arthritis Mother   . Cancer Mother   . Heart disease Mother   . Diabetes Mother   . Heart disease Father   . Hypertension Father   . Alcohol abuse Brother   . Alcohol abuse Maternal Uncle   . Alcohol abuse Maternal Grandmother    Social History  Substance Use Topics  . Smoking status: Never Smoker  . Smokeless tobacco: Never Used  . Alcohol use Yes     Comment: OCCASSIONAL   Current Medications and Allergies:   .  Cyanocobalamin (VITAMIN B-12) 2500 MCG SUBL, Place under the tongue., Disp: , Rfl:  .  diclofenac (VOLTAREN) 75 MG EC tablet, Take 1 tablet (75 mg total) by mouth 2 (two)  times daily., Disp: 30 tablet, Rfl: 0 .  Multiple Vitamin (MULTIVITAMIN) tablet, Take 1 tablet by mouth daily., Disp: , Rfl:   No Known Allergies   Review of Systems:   Pertinent items are noted in the HPI. Otherwise, ROS is negative.  Vitals:   Vitals:   07/05/17 1518  BP: 128/74  Pulse: 73  Temp: 98 F (36.7 C)  TempSrc: Oral  SpO2: 97%  Weight: 149 lb 12.8 oz (67.9 kg)  Height: 4\' 11"  (1.499 m)     Body mass index is 30.26 kg/m.   Physical Exam:   Physical Exam  Constitutional: She is oriented to person, place, and time. She appears well-developed and well-nourished. No distress.  HENT:  Head: Normocephalic and atraumatic.  Right Ear: External ear normal.  Left Ear: External ear normal.  Nose: Nose normal.  Mouth/Throat: Oropharynx is clear and moist.  Eyes: Pupils are equal, round, and reactive to light. Conjunctivae and EOM are normal.  Neck: Normal range of motion. Neck supple.  Cardiovascular: Normal rate, regular rhythm, normal heart sounds and intact distal pulses.   Pulmonary/Chest: Effort normal and breath sounds normal.  Abdominal: Soft. Bowel sounds are normal.  Neurological: She is alert and oriented to person, place, and time.  Skin: Skin is warm.  Psychiatric: She has a normal mood and affect. Her behavior is normal.  Nursing note and vitals reviewed.  Assessment and Plan:   [place] was seen today for establish care.  Diagnoses and all orders for this visit:  Morbid obesity ([place]) Comments: The patient is asked to make an attempt to improve diet and exercise patterns to aid in medical management of this problem.  Orders: -     phentermine (ADIPEX-P) 37.5 MG tablet; Take 1 tablet (37.5 mg total) by mouth daily before breakfast. -     topiramate (TOPAMAX) 50 MG tablet; Take 1 tablet (50 mg total) by mouth 2 (two) times daily.  Encounter for screening mammogram for malignant neoplasm of breast -     Cancel: MM SCREENING BREAST TOMO BILATERAL;  Future  Medication management -     EKG 12-Lead  . Reviewed expectations re: course of current medical issues. . Discussed self-management of symptoms. . Outlined signs and symptoms indicating need for more acute intervention. . Patient verbalized understanding and all questions were answered. Marland Kitchen Health Maintenance issues including appropriate healthy diet, exercise, and smoking avoidance were discussed with patient. . See orders for this visit as documented in the electronic medical record. . Patient received an After Visit Summary.  CMA served as Education administrator during this visit. History, Physical, and Plan performed by medical provider. The above documentation has been reviewed and is accurate and complete. Briscoe Deutscher, D.O.  Records requested if needed. Time spent with the patient: 30 minutes, of which >50% was spent in obtaining information about her symptoms, reviewing her previous labs, evaluations, and treatments, counseling her about her condition (please see the discussed topics above), and developing a plan to further investigate it; she had a number of questions which I addressed.   Briscoe Deutscher, DO [place] 07/09/2017  Future Appointments Date Time Provider [place]  07/19/2017 3:00 PM [facility] MM 3 [facility]  08/05/2017 3:45 PM Briscoe Deutscher, DO [facility] None

## 2017-07-09 ENCOUNTER — Other Ambulatory Visit: Payer: Self-pay | Admitting: Family Medicine

## 2017-07-09 ENCOUNTER — Encounter: Payer: Self-pay | Admitting: Family Medicine

## 2017-07-09 DIAGNOSIS — Z1231 Encounter for screening mammogram for malignant neoplasm of breast: Secondary | ICD-10-CM

## 2017-07-19 ENCOUNTER — Ambulatory Visit
Admission: RE | Admit: 2017-07-19 | Discharge: 2017-07-19 | Disposition: A | Discharge status: Home / Self Care | Payer: 59 | Source: Ambulatory Visit | Attending: Family Medicine | Admitting: Family Medicine

## 2017-07-19 DIAGNOSIS — Z1231 Encounter for screening mammogram for malignant neoplasm of breast: Secondary | ICD-10-CM

## 2017-07-30 ENCOUNTER — Ambulatory Visit: Payer: 59 | Admitting: Sports Medicine

## 2017-08-05 ENCOUNTER — Ambulatory Visit (INDEPENDENT_AMBULATORY_CARE_PROVIDER_SITE_OTHER): Payer: 59 | Admitting: Family Medicine

## 2017-08-05 ENCOUNTER — Encounter: Payer: Self-pay | Admitting: Family Medicine

## 2017-08-05 MED ORDER — TOPIRAMATE ER 50 MG PO CAP24: 1.0000 | ORAL_CAPSULE | Freq: Every day | ORAL | 0 refills | Status: DC

## 2017-08-05 MED ORDER — PHENTERMINE HCL 37.5 MG PO TABS: 37.5000 mg | ORAL_TABLET | Freq: Every day | ORAL | 0 refills | Status: DC

## 2017-08-05 NOTE — Progress Notes (Signed)
Shelly Reynolds is a 61 y.o. female is here for follow up.  History of Present Illness:   Water quality scientist, CMA, acting as scribe for Dr. Juleen China.  HPI: Doing well. Following diet most of time. Exercising but not regularly. Tolerating medications well.   Wt Readings from Last 3 Encounters:  08/05/17 144 lb (65.3 kg)  07/05/17 149 lb 12.8 oz (67.9 kg)  05/21/17 147 lb 12.8 oz (67 kg)   Health Maintenance Due  Topic Date Due  . Hepatitis C Screening  1955/10/21  . HIV Screening  06/28/1972   Depression screen PHQ 2/9 08/05/2017  Decreased Interest 0  Down, Depressed, Hopeless 0  PHQ - 2 Score 0   PMHx, SurgHx, SocialHx, FamHx, Medications, and Allergies were reviewed in the Visit Navigator and updated as appropriate.   Patient Active Problem List   Diagnosis Date Noted  . Chronic pain of left knee 05/21/2017  . S/P gastric bypass 05/21/2017  . 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 Smoking status: Never Smoker  . Smokeless tobacco: Never Used  . Alcohol use Yes     Comment: OCCASSIONAL   Current Medications and Allergies:   .  Cyanocobalamin (VITAMIN B-12) 2500 MCG SUBL, Place under the tongue., Disp: , Rfl:  .  diclofenac (VOLTAREN) 75 MG EC tablet, Take 1 tablet (75 mg total) by mouth 2 (two) times daily., Disp: 30 tablet, Rfl: 0 .  Multiple Vitamin (MULTIVITAMIN) tablet, Take 1 tablet by mouth daily., Disp: , Rfl:  .  phentermine (ADIPEX-P) 37.5 MG tablet, Take 1 tablet (37.5 mg total) by mouth daily before breakfast., Disp: 30 tablet, Rfl: 0 .  topiramate (TOPAMAX) 50 MG tablet, Take 1 tablet (50 mg total) by mouth 2 (two) times daily., Disp: 60 tablet, Rfl: 0  No Known Allergies   Review of Systems   Pertinent items are noted in the HPI. Otherwise, ROS is negative.  Vitals:   Vitals:   08/05/17 1530  BP: 124/74  Pulse: 75  Temp: 98 F (36.7 C)  TempSrc: Oral  SpO2: 97%  Weight: 144 lb (65.3 kg)  Height: 4\' 11"   (1.499 m)     Body mass index is 29.08 kg/m.   Physical Exam:   Physical Exam  Constitutional: She appears well-developed and well-nourished. No distress.  HENT:  Head: Normocephalic and atraumatic.  Eyes: Pupils are equal, round, and reactive to light. EOM are normal.  Neck: Normal range of motion. Neck supple.  Cardiovascular: Normal rate, regular rhythm, normal heart sounds and intact distal pulses.   Pulmonary/Chest: Effort normal.  Abdominal: Soft.  Skin: Skin is warm.  Psychiatric: She has a normal mood and affect. Her behavior is normal.  Nursing note and vitals reviewed.   Assessment and Plan:   [place] was seen today for follow-up.  Diagnoses and all orders for this visit:  Morbid obesity ([place]) Comments: Continue current treatment. Trokendi samples provided. Recheck in one month. The patient is asked to make an attempt to improve diet and exercise patterns to aid in medical management of this problem.  Orders: -     phentermine (ADIPEX-P) 37.5 MG tablet; Take 1 tablet (37.5 mg total) by mouth daily before breakfast. -     Topiramate ER (TROKENDI XR) 50 MG CP24; Take 1 capsule by mouth daily.  . Reviewed expectations re: course of current medical issues. . Discussed self-management of symptoms. . Outlined signs and symptoms indicating need for more acute  intervention. . Patient verbalized understanding and all questions were answered. Marland Kitchen Health Maintenance issues including appropriate healthy diet, exercise, and smoking avoidance were discussed with patient. . See orders for this visit as documented in the electronic medical record. . Patient received an After Visit Summary.  CMA served as Education administrator during this visit. History, Physical, and Plan performed by medical provider. The above documentation has been reviewed and is accurate and complete. Briscoe Deutscher, D.O.  Briscoe Deutscher, DO [place] 08/06/2017  Future Appointments Date Time Provider  [place]  09/03/2017 3:00 PM Briscoe Deutscher, DO [facility] None

## 2017-08-15 ENCOUNTER — Telehealth: Payer: Self-pay | Admitting: Family Medicine

## 2017-08-15 NOTE — Telephone Encounter (Signed)
Patient called in reference to samples for trokendi. Patient stated these worked for her and she believes these needs to be pre approved and the number to call for that is [phone]. Patient would like to use [facility]. Please advise.       [place], [place] - [address] [phone] (Phone) [phone] (Fax)

## 2017-08-15 NOTE — Telephone Encounter (Signed)
Ok to send in script for Trokendi 50 mg daily?

## 2017-08-17 NOTE — Telephone Encounter (Signed)
Yes

## 2017-08-19 ENCOUNTER — Other Ambulatory Visit: Payer: Self-pay

## 2017-08-19 MED ORDER — TOPIRAMATE ER 50 MG PO CAP24: 1.0000 | ORAL_CAPSULE | Freq: Every day | ORAL | 3 refills | Status: DC

## 2017-08-19 NOTE — Telephone Encounter (Signed)
Trokendi has been sent to patient's pharmacy.

## 2017-09-03 ENCOUNTER — Encounter: Payer: Self-pay | Admitting: Family Medicine

## 2017-09-03 ENCOUNTER — Ambulatory Visit: Payer: 59 | Admitting: Family Medicine

## 2017-09-03 VITALS — BP 114/76 | HR 86 | Temp 98.2°F | Ht 59.0 in | Wt 140.0 lb

## 2017-09-03 DIAGNOSIS — G8929 Other chronic pain: Secondary | ICD-10-CM | POA: Diagnosis not present

## 2017-09-03 DIAGNOSIS — M25562 Pain in left knee: Secondary | ICD-10-CM

## 2017-09-03 DIAGNOSIS — E663 Overweight: Secondary | ICD-10-CM

## 2017-09-03 MED ORDER — PHENTERMINE HCL 37.5 MG PO TABS: 37.5000 mg | ORAL_TABLET | Freq: Every day | ORAL | 2 refills | Status: DC

## 2017-09-08 ENCOUNTER — Encounter: Payer: Self-pay | Admitting: Family Medicine

## 2017-09-08 NOTE — Progress Notes (Signed)
Shelly Reynolds is a 61 y.o. female is here for follow up.  History of Present Illness:   HPI:   1. Chronic pain of left knee. Improving with weight loss. No new symptoms or concerns.    2. Overweight (BMI 25.0-29.9). Doing well. Tolerating regimen of Trokendi and Phentermine. Cardiovascular ROS: negative for - chest pain, dyspnea on exertion, edema or irregular heartbeat. No trouble with sleep. Goal weight is < 130 pounds.  Wt Readings from Last 3 Encounters:  09/03/17 140 lb (63.5 kg)  08/05/17 144 lb (65.3 kg)  07/05/17 149 lb 12.8 oz (67.9 kg)     Health Maintenance Due  Topic Date Due  . Hepatitis C Screening  12/08/1955  . HIV Screening  06/28/1972   Depression screen PHQ 2/9 08/05/2017  Decreased Interest 0  Down, Depressed, Hopeless 0  PHQ - 2 Score 0   PMHx, SurgHx, SocialHx, FamHx, Medications, and Allergies were reviewed in the Visit Navigator and updated as appropriate.   Patient Active Problem List   Diagnosis Date Noted  . Chronic pain of left knee 05/21/2017  . S/P gastric bypass 05/21/2017  . 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 Smoking status: Never Smoker  . Smokeless tobacco: Never Used  Substance Use Topics  . Alcohol use: Yes    Comment: OCCASSIONAL  . Drug use: No   Current Medications and Allergies:   .  Cyanocobalamin (VITAMIN B-12) 2500 MCG SUBL, Place under the tongue., Disp: , Rfl:  .  diclofenac (VOLTAREN) 75 MG EC tablet, Take 1 tablet (75 mg total) by mouth 2 (two) times daily., Disp: 30 tablet, Rfl: 0 .  Multiple Vitamin (MULTIVITAMIN) tablet, Take 1 tablet by mouth daily., Disp: , Rfl:  .  Topiramate ER (TROKENDI XR) 50 MG CP24, Take 1 capsule daily by mouth., Disp: 30 capsule, Rfl: 3 .  phentermine (ADIPEX-P) 37.5 MG tablet, Take 1 tablet (37.5 mg total) by mouth daily before breakfast., Disp: 30 tablet, Rfl: 2  No Known Allergies   Review of Systems   Pertinent items are noted in the HPI.  Otherwise, ROS is negative.  Vitals:   Vitals:   09/03/17 1505  BP: 114/76  Pulse: 86  Temp: 98.2 F (36.8 C)  TempSrc: Oral  SpO2: 97%  Weight: 140 lb (63.5 kg)  Height: 4\' 11"  (1.499 m)     Body mass index is 28.28 kg/m.   Physical Exam:   Physical Exam  Constitutional: She is oriented to person, place, and time. She appears well-developed and well-nourished. No distress.  HENT:  Head: Normocephalic and atraumatic.  Right Ear: External ear normal.  Left Ear: External ear normal.  Nose: Nose normal.  Mouth/Throat: Oropharynx is clear and moist.  Eyes: Conjunctivae and EOM are normal. Pupils are equal, round, and reactive to light.  Neck: Normal range of motion. Neck supple.  Cardiovascular: Normal rate, regular rhythm, normal heart sounds and intact distal pulses.  Pulmonary/Chest: Effort normal.  Abdominal: Soft.  Musculoskeletal: Normal range of motion.  Neurological: She is alert and oriented to person, place, and time.  Skin: Skin is warm.  Psychiatric: She has a normal mood and affect. Her behavior is normal.  Nursing note and vitals reviewed.   Assessment and Plan:   Diagnoses and all orders for this visit:  Chronic pain of left knee Comments: Pain is improving with continued weight loss. 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  Overweight (BMI 25.0-29.9) Comments: Doing well n current treatment. Well controlled.  No signs of complications, medication side effects, or red flags.  Continue current regimen. The patient is asked to make an attempt to improve diet and exercise patterns to aid in medical management of this problem. Recheck in 3 months.  Orders: -     phentermine (ADIPEX-P) 37.5 MG tablet; Take 1 tablet (37.5 mg total) by mouth daily before breakfast. X 3 months.    . Reviewed expectations re: course of current medical issues. . Discussed self-management of  symptoms. . Outlined signs and symptoms indicating need for more acute intervention. . Patient verbalized understanding and all questions were answered. Marland Kitchen Health Maintenance issues including appropriate healthy diet, exercise, and smoking avoidance were discussed with patient. . See orders for this visit as documented in the electronic medical record. . Patient received an After Visit Summary.  Briscoe Deutscher, DO [place] 09/08/2017  Future Appointments  Date Time Provider [place]  12/03/2017  3:00 PM Briscoe Deutscher, DO [facility] None

## 2017-10-10 ENCOUNTER — Ambulatory Visit: Payer: 59 | Admitting: Family Medicine

## 2017-10-11 ENCOUNTER — Encounter: Payer: Self-pay | Admitting: Family Medicine

## 2017-10-11 ENCOUNTER — Ambulatory Visit: Payer: 59 | Admitting: Family Medicine

## 2017-10-11 VITALS — BP 126/84 | HR 87 | Temp 98.1°F | Ht 59.0 in | Wt 140.4 lb

## 2017-10-11 DIAGNOSIS — D1724 Benign lipomatous neoplasm of skin and subcutaneous tissue of left leg: Secondary | ICD-10-CM | POA: Diagnosis not present

## 2017-10-11 NOTE — Progress Notes (Signed)
   Shelly Reynolds is a 61 y.o. female here for an acute visit.  History of Present Illness:   Shaune Pascal CMA acting as scribe for Dr. Juleen China.  HPI: Patient comes in today for an lump that is on her inner left thigh. She noticed it about two weeks before Christmas. She stated that her leg would sometimes ache. Denies any swelling, redness, or pain. No trauma. Significant weight loss resulting in loose skin.  PMHx, SurgHx, SocialHx, Medications, and Allergies were reviewed in the Visit Navigator and updated as appropriate.  Current Medications:   .  Cyanocobalamin (VITAMIN B-12) 2500 MCG SUBL, Place under the tongue., Disp: , Rfl:  .  diclofenac (VOLTAREN) 75 MG EC tablet, Take 1 tablet (75 mg total) by mouth 2 (two) times daily., Disp: 30 tablet, Rfl: 0 .  Multiple Vitamin (MULTIVITAMIN) tablet, Take 1 tablet by mouth daily., Disp: , Rfl:  .  phentermine (ADIPEX-P) 37.5 MG tablet, Take 1 tablet (37.5 mg total) by mouth daily before breakfast., Disp: 30 tablet, Rfl: 2 .  Topiramate ER (TROKENDI XR) 50 MG CP24, Take 1 capsule daily by mouth., Disp: 30 capsule, Rfl: 3   No Known Allergies   Review of Systems:   Pertinent items are noted in the HPI. Otherwise, ROS is negative.  Vitals:   Vitals:   10/11/17 1313  BP: 126/84  Pulse: 87  Temp: 98.1 F (36.7 C)  TempSrc: Oral  SpO2: 97%  Weight: 140 lb 6.4 oz (63.7 kg)  Height: 4\' 11"  (1.499 m)     Body mass index is 28.36 kg/m.  Physical Exam:   Physical Exam  Constitutional: She appears well-nourished.  HENT:  Head: Normocephalic and atraumatic.  Eyes: EOM are normal. Pupils are equal, round, and reactive to light.  Neck: Normal range of motion. Neck supple.  Cardiovascular: Normal rate, regular rhythm, normal heart sounds and intact distal pulses.  Pulmonary/Chest: Effort normal.  Abdominal: Soft.  Skin: Skin is warm.     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 No skin changes. No lymphadenopathy.  Psychiatric: She has a normal  mood and affect. Her behavior is normal.  Nursing note and vitals reviewed.   Assessment and Plan:   1. Lipoma of left lower extremity The patient is very worried about the possibility of cancer. Low risk. Okay for [place].  - [place] LT LOWER EXTREM LTD SOFT TISSUE NON VASCULAR; Future   . Reviewed expectations re: course of current medical issues. . Discussed self-management of symptoms. . Outlined signs and symptoms indicating need for more acute intervention. . Patient verbalized understanding and all questions were answered. Marland Kitchen Health Maintenance issues including appropriate healthy diet, exercise, and smoking avoidance were discussed with patient. . See orders for this visit as documented in the electronic medical record. . Patient received an After Visit Summary.   Briscoe Deutscher, DO [place] 10/11/2017

## 2017-10-16 ENCOUNTER — Ambulatory Visit (HOSPITAL_COMMUNITY)
Admission: RE | Admit: 2017-10-16 | Discharge: 2017-10-16 | Disposition: A | Discharge status: Home / Self Care | Payer: BLUE CROSS/BLUE SHIELD | Source: Ambulatory Visit | Attending: Family Medicine | Admitting: Family Medicine

## 2017-10-16 DIAGNOSIS — D1724 Benign lipomatous neoplasm of skin and subcutaneous tissue of left leg: Secondary | ICD-10-CM | POA: Diagnosis present

## 2017-12-03 ENCOUNTER — Ambulatory Visit: Payer: 59 | Admitting: Family Medicine

## 2017-12-03 ENCOUNTER — Encounter: Payer: Self-pay | Admitting: Family Medicine

## 2017-12-03 ENCOUNTER — Ambulatory Visit: Payer: No Typology Code available for payment source | Admitting: Family Medicine

## 2017-12-03 VITALS — BP 120/80 | HR 89 | Temp 97.6°F | Ht 59.0 in | Wt 141.8 lb

## 2017-12-03 DIAGNOSIS — E663 Overweight: Secondary | ICD-10-CM | POA: Diagnosis not present

## 2017-12-03 DIAGNOSIS — M5410 Radiculopathy, site unspecified: Secondary | ICD-10-CM | POA: Diagnosis not present

## 2017-12-03 DIAGNOSIS — G4700 Insomnia, unspecified: Secondary | ICD-10-CM

## 2017-12-03 DIAGNOSIS — M79676 Pain in unspecified toe(s): Secondary | ICD-10-CM | POA: Diagnosis not present

## 2017-12-03 MED ORDER — LORAZEPAM 0.5 MG PO TABS: 0.5000 mg | ORAL_TABLET | Freq: Every day | ORAL | 1 refills | Status: DC

## 2017-12-03 MED ORDER — PHENTERMINE HCL 37.5 MG PO TABS: 37.5000 mg | ORAL_TABLET | Freq: Every day | ORAL | 2 refills | Status: DC

## 2017-12-03 MED ORDER — IBUPROFEN-FAMOTIDINE 800-26.6 MG PO TABS: 1.0000 | ORAL_TABLET | Freq: Two times a day (BID) | ORAL | 0 refills | Status: DC

## 2017-12-03 NOTE — Patient Instructions (Signed)
Web sites with good information about insomnia  [employer] on [employer] on Sleep for Older adults MarketVoip.es  [employer] of [employer] and Aging information PodSocket.fi  Try the app - CBT-I Coach.

## 2017-12-03 NOTE — Progress Notes (Signed)
Shelly Reynolds is a 62 y.o. female is here for follow up.  History of Present Illness:   Shaune Pascal CMA acting as scribe for Dr. Juleen China.  HPI: Patient comes in today for follow up on her weight loss medication. Patient stated that she does not believe the medication is working anymore. She has gained 1 pound since her last visit 10/11/17. Patient stated that she does not exercise or change any of her eating habits.    There are no preventive care reminders to display for this patient. Depression screen PHQ 2/9 08/05/2017  Decreased Interest 0  Down, Depressed, Hopeless 0  PHQ - 2 Score 0   PMHx, SurgHx, SocialHx, FamHx, Medications, and Allergies were reviewed in the Visit Navigator and updated as appropriate.   Patient Active Problem List   Diagnosis Date Noted  . Chronic pain of left knee 05/21/2017  . S/P gastric bypass 05/21/2017  . 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 Smoking status: Never Smoker  . Smokeless tobacco: Never Used  Substance Use Topics  . Alcohol use: Yes    Comment: OCCASSIONAL  . Drug use: No   Current Medications and Allergies:   .  Cyanocobalamin (VITAMIN B-12) 2500 MCG SUBL, Place under the tongue., Disp: , Rfl:  .  Multiple Vitamin (MULTIVITAMIN) tablet, Take 1 tablet by mouth daily., Disp: , Rfl:  .  phentermine (ADIPEX-P) 37.5 MG tablet, Take 1 tablet (37.5 mg total) by mouth daily before breakfast., Disp: 30 tablet, Rfl: 2 .  Topiramate ER (TROKENDI XR) 50 MG CP24, Take 1 capsule daily by mouth., Disp: 30 capsule, Rfl: 3  No Known Allergies   Review of Systems   Pertinent items are noted in the HPI. Otherwise, ROS is negative.  Vitals:   Vitals:   12/03/17 1517  BP: 120/80  Pulse: 89  Temp: 97.6 F (36.4 C)  TempSrc: Oral  SpO2: 97%  Weight: 141 lb 12.8 oz (64.3 kg)  Height: 4\' 11"  (1.499 m)     Body mass index is 28.64 kg/m.  Physical Exam:   Physical Exam  Constitutional: She is  oriented to person, place, and time. She appears well-developed and well-nourished. No distress.  HENT:  Head: Normocephalic and atraumatic.  Right Ear: External ear normal.  Left Ear: External ear normal.  Nose: Nose normal.  Mouth/Throat: Oropharynx is clear and moist.  Eyes: Conjunctivae and EOM are normal. Pupils are equal, round, and reactive to light.  Neck: Normal range of motion. Neck supple. No thyromegaly present.  Cardiovascular: Normal rate, regular rhythm, normal heart sounds and intact distal pulses.  Pulmonary/Chest: Effort normal and breath sounds normal.  Abdominal: Soft. Bowel sounds are normal.  Musculoskeletal: Normal range of motion.  Lymphadenopathy:    She has no cervical adenopathy.  Neurological: She is alert and oriented to person, place, and time.  Skin: Skin is warm and dry. Capillary refill takes less than 2 seconds.  Psychiatric: She has a normal mood and affect. Her behavior is normal.  Nursing note and vitals reviewed.   Assessment and Plan:   1. Overweight (BMI 25.0-29.9) Patient has maintained her weight.  She continues with the Adipex and Trokendi.  She is exercising by walking 3 miles per day.  She has not changed her eating habits significantly.  She has no side effects to the medications.  Her major issue seems to be insomnia.  This is a [place]-standing issue for her.  She  has tried melatonin without relief of her symptoms.  She has never had a sleep study.  She states that she gets about 3-4 hours of sleep per night.  She does not take daytime naps.  She has difficulty with falling and staying asleep.  - phentermine (ADIPEX-P) 37.5 MG tablet; Take 1 tablet (37.5 mg total) by mouth daily before breakfast.  Dispense: 30 tablet; Refill: 2  2. Pain of toe Patient has calluses between 2 of her lesser toes.  Because of the area, I am going to refer her to podiatry for further evaluation.  - Ambulatory referral to Podiatry  3. Insomnia, unspecified  type We discussed multiple ways to combat insomnia.  This is severe for this patient.  She is working very hard on weight loss, so would like to avoid trazodone today.  May try a SSRI in the future.  For the short-term, I would like for her to take below medication for 2 weeks.  I have also recommended an app that is recommended by the [facility] for cognitive behavioral therapy therapy for insomnia.  - LORazepam (ATIVAN) 0.5 MG tablet; Take 1 tablet (0.5 mg total) by mouth at bedtime.  Dispense: 30 tablet; Refill: 1  4. Radicular pain of left lower extremity Ongoing.  The patient has a flare every few weeks.  This is worsened by exercise.  She has known spondylosis of her lumbar joint.  She has had an epidural in the past.  She does try to avoid anti-inflammatories due to her gastric bypass.  Okay below treatment as needed with precautions.  - Ibuprofen-Famotidine (DUEXIS) 800-26.6 MG TABS; Take 1 tablet by mouth 2 (two) times daily.  Dispense: 9 tablet; Refill: 0   . Reviewed expectations re: course of current medical issues. . Discussed self-management of symptoms. . Outlined signs and symptoms indicating need for more acute intervention. . Patient verbalized understanding and all questions were answered. Marland Kitchen Health Maintenance issues including appropriate healthy diet, exercise, and smoking avoidance were discussed with patient. . See orders for this visit as documented in the electronic medical record. . Patient received an After Visit Summary.  CMA served as Education administrator during this visit. History, Physical, and Plan performed by medical provider. The above documentation has been reviewed and is accurate and complete. Briscoe Deutscher, D.O.  Briscoe Deutscher, DO [place] 12/04/2017

## 2017-12-24 ENCOUNTER — Encounter: Payer: Self-pay | Admitting: Podiatry

## 2017-12-24 ENCOUNTER — Ambulatory Visit: Payer: No Typology Code available for payment source | Admitting: Podiatry

## 2017-12-24 VITALS — BP 151/86 | HR 68 | Ht 59.0 in | Wt 140.0 lb

## 2017-12-24 DIAGNOSIS — B351 Tinea unguium: Secondary | ICD-10-CM

## 2017-12-24 DIAGNOSIS — Z79899 Other long term (current) drug therapy: Secondary | ICD-10-CM

## 2017-12-24 DIAGNOSIS — L84 Corns and callosities: Secondary | ICD-10-CM | POA: Diagnosis not present

## 2017-12-24 MED ORDER — TERBINAFINE HCL 250 MG PO TABS: 250.0000 mg | ORAL_TABLET | Freq: Every day | ORAL | 0 refills | Status: DC

## 2017-12-24 NOTE — Progress Notes (Signed)
Subjective:    Patient ID: Shelly Reynolds, female    DOB: 01-06-1956, 62 y.o.   MRN: [id number]  HPI  Chief Complaint  Patient presents with  . Toe Pain    Right great toe and second toe - painful corns on each toe - rubs together and hurts  . Nail Problem    Hx of toenail fungus that is coming back.    62 year old female presents the office today for concerns of a corn between her big toe and the second toe on the right foot as well as for toenail fungus.  She states that she is tried some over-the-counter treatment for the nail fungus and is not been helpful.  She states that the corn between her big toe and second toe is painful with pressure in shoes but she denies any redness or drainage coming from the area.  She has no other concerns today. Review of Systems  All other systems reviewed and are negative.  Past Medical History:  Diagnosis Date  . Arthritis   . Depression   . GERD (gastroesophageal reflux disease)   . Hypertension   . Urine incontinence     Past Surgical History:  Procedure Laterality Date  . ABDOMINAL HYSTERECTOMY  2011  . BLADDER REPAIR    . BREAST REDUCTION SURGERY    . GASTRIC BYPASS    . REDUCTION MAMMAPLASTY Bilateral   . TONSILLECTOMY AND ADENOIDECTOMY Bilateral 1962  . TUBAL LIGATION    . TUMMY TUCK N/A      Current Outpatient Medications:  .  Cyanocobalamin (VITAMIN B-12) 2500 MCG SUBL, Place under the tongue., Disp: , Rfl:  .  Ibuprofen-Famotidine (DUEXIS) 800-26.6 MG TABS, Take 1 tablet by mouth 2 (two) times daily., Disp: 9 tablet, Rfl: 0 .  LORazepam (ATIVAN) 0.5 MG tablet, Take 1 tablet (0.5 mg total) by mouth at bedtime., Disp: 30 tablet, Rfl: 1 .  Multiple Vitamin (MULTIVITAMIN) tablet, Take 1 tablet by mouth daily., Disp: , Rfl:  .  phentermine (ADIPEX-P) 37.5 MG tablet, Take 1 tablet (37.5 mg total) by mouth daily before breakfast., Disp: 30 tablet, Rfl: 2 .  terbinafine (LAMISIL) 250 MG tablet, Take 1 tablet (250 mg total) by  mouth daily., Disp: 90 tablet, Rfl: 0 .  Topiramate ER (TROKENDI XR) 50 MG CP24, Take 1 capsule daily by mouth., Disp: 30 capsule, Rfl: 3  No Known Allergies  Social History   Socioeconomic History  . Marital status: Married    Spouse name: Not on file  . Number of children: Not on file  . Years of education: Not on file  . Highest education level: Not on file  Social Needs  . Financial resource strain: Not on file  . Food insecurity - worry: Not on file  . Food insecurity - inability: Not on file  . Transportation needs - medical: Not on file  . Transportation needs - non-medical: Not on file  Occupational History  . Not on file  Tobacco Use  . Smoking status: Never Smoker  . Smokeless tobacco: Never Used  Substance and Sexual Activity  . Alcohol use: Yes    Comment: OCCASSIONAL  . Drug use: No  . Sexual activity: Yes    Comment: HYSTERECTOMY  Other Topics Concern  . Not on file  Social History Narrative  . Not on file         Objective:   Physical Exam  General: AAO x3, NAD  Dermatological: Hyperkeratotic lesion along the IPJ of  the hallux and second toe corresponding to pressure.  Upon debridement there is no underlying ulceration, drainage or any signs of infection.  The nails appear to be somewhat hypertrophic, dystrophic with yellow-brown discoloration.  There is no pain in the nails there is no surrounding redness or drainage or any clinical signs of infection.  There is no other open lesions or pre-ulcerative lesions.  Vascular: Dorsalis Pedis artery and Posterior Tibial artery pedal pulses are 2/4 bilateral with immedate capillary fill time.  There is no pain with calf compression, swelling, warmth, erythema.   Neruologic: Grossly intact via light touch bilateral.  Protective threshold with Semmes Wienstein monofilament intact to all pedal sites bilateral.   Musculoskeletal: HAV is present in the hallux and the second toe or abutting.  Muscular strength 5/5  in all groups tested bilateral.  Gait: Unassisted, Nonantalgic.     Assessment & Plan:  62 year old female with hyperkeratotic lesion due to digital deformity, onychomycosis -Treatment options discussed including all alternatives, risks, and complications -Etiology of symptoms were discussed -Hyperkeratotic lesion Sharpy debrided x1 without any complications or bleeding.  Discussed offloading pads were dispensed to him.  Also discussed shoe modifications.  In the future consider surgical intervention if needed. -Regards the nail fungus but discussed both oral and topical treatment as well as laser therapy.  She does like to proceed with oral therapy.  Discussed Lamisil as well as side effects and success rates.  We will check a CBC and LFT prior to starting medication.  I will follow-up with her in 6 weeks to see how she is doing.  Trula Slade DPM

## 2017-12-24 NOTE — Patient Instructions (Signed)

## 2017-12-25 ENCOUNTER — Telehealth: Payer: Self-pay | Admitting: *Deleted

## 2017-12-25 LAB — HEPATIC FUNCTION PANEL
AG Ratio: 2 (calc) (ref 1.0–2.5)
ALBUMIN MSPROF: 4.1 g/dL (ref 3.6–5.1)
ALT: 18 U/L (ref 6–29)
AST: 19 U/L (ref 10–35)
Alkaline phosphatase (APISO): 99 U/L (ref 33–130)
BILIRUBIN DIRECT: 0 mg/dL (ref 0.0–0.2)
Globulin: 2.1 g/dL (calc) (ref 1.9–3.7)
Indirect Bilirubin: 0.3 mg/dL (calc) (ref 0.2–1.2)
Total Bilirubin: 0.3 mg/dL (ref 0.2–1.2)
Total Protein: 6.2 g/dL (ref 6.1–8.1)

## 2017-12-25 LAB — CBC WITH DIFFERENTIAL/PLATELET
BASOS ABS: 48 {cells}/uL (ref 0–200)
BASOS PCT: 0.7 %
EOS PCT: 0.7 %
Eosinophils Absolute: 48 cells/uL (ref 15–500)
HCT: 37.2 % (ref 35.0–45.0)
Hemoglobin: 12.7 g/dL (ref 11.7–15.5)
LYMPHS ABS: 2167 {cells}/uL (ref 850–3900)
MCH: 30 pg (ref 27.0–33.0)
MCHC: 34.1 g/dL (ref 32.0–36.0)
MCV: 87.9 fL (ref 80.0–100.0)
MPV: 10.4 fL (ref 7.5–12.5)
Monocytes Relative: 7.7 %
NEUTROS ABS: 4106 {cells}/uL (ref 1500–7800)
Neutrophils Relative %: 59.5 %
PLATELETS: 286 10*3/uL (ref 140–400)
RBC: 4.23 10*6/uL (ref 3.80–5.10)
RDW: 12.6 % (ref 11.0–15.0)
Total Lymphocyte: 31.4 %
WBC mixed population: 531 cells/uL (ref 200–950)
WBC: 6.9 10*3/uL (ref 3.8–10.8)

## 2017-12-25 NOTE — Telephone Encounter (Signed)
I informed pt of Dr. Wagoner's review of results and orders. 

## 2017-12-25 NOTE — Telephone Encounter (Signed)
-----   Message from Trula Slade, DPM sent at 12/25/2017 12:44 PM EDT ----- OK to start Lamisil. Please let her know. Thanks.

## 2017-12-30 ENCOUNTER — Other Ambulatory Visit: Payer: Self-pay | Admitting: Family Medicine

## 2018-01-03 ENCOUNTER — Telehealth: Payer: Self-pay | Admitting: *Deleted

## 2018-01-03 NOTE — Telephone Encounter (Signed)
Left message informing pt, I had called another [place] [facility] and was informed cost for #30 = $18.54, and cost for #90 = $51.62 if she wanted to self-pay for the medication. I also informed pt the PA form from the pharmacy had not been sent to our office, if she would like she could have the PA request form faxed to [phone].

## 2018-01-03 NOTE — Telephone Encounter (Signed)
I received a voicemail from your office today about a prescription I needed a prior authorization for my insurance company. [facility] on [address]. I just talked to them and they said they sent you the form on 15 March but they just re-sent it to you again. So hopefully you receive this one. So if you can get that pre-approved for my insurance company. My number is [phone].

## 2018-01-03 NOTE — Telephone Encounter (Signed)
Pt states the Lamisil needs a PA.

## 2018-01-06 NOTE — Telephone Encounter (Signed)
I have not received any PA on this yet

## 2018-01-07 NOTE — Telephone Encounter (Signed)
Called [id number] and asked to have the PA request sent to [phone].

## 2018-01-10 ENCOUNTER — Telehealth: Payer: Self-pay | Admitting: Podiatry

## 2018-01-10 NOTE — Telephone Encounter (Signed)
I was seen on 12 March and given a prescription for Lamisil. My insurance company needs pre-approval, I need pre-approval from them. [facility] [address] says twice they have sent a form to your office for the pre-approval from my insurance company. I have not received any medication yet. I would appreciate it if someone would call and let me know if they received the pre-approval yet. My number is [phone]. Thank you.

## 2018-01-10 NOTE — Telephone Encounter (Signed)
I informed pt, the prior authorization had been started and I would send a note to the person that performs the PA for a status.

## 2018-01-15 ENCOUNTER — Telehealth: Payer: Self-pay | Admitting: *Deleted

## 2018-01-15 NOTE — Telephone Encounter (Signed)
Pt called states she has had to cancel her appt for the end of this month, because she had not received her medication and this is the 3rd time she has called.

## 2018-01-15 NOTE — Telephone Encounter (Signed)
I told pt I did not know what had happened but it was not Dr. Leigh Aurora fault and I was having my supervisor investigate and would get back with her tomorrow.

## 2018-01-16 NOTE — Telephone Encounter (Signed)
Fax received from [facility] 01/16/2018 - 04/17/2018. Gretta Arab, RN informed pt.

## 2018-02-01 ENCOUNTER — Other Ambulatory Visit: Payer: Self-pay | Admitting: Family Medicine

## 2018-02-03 NOTE — Telephone Encounter (Signed)
Please advise on refill.

## 2018-02-11 ENCOUNTER — Ambulatory Visit: Payer: No Typology Code available for payment source | Admitting: Podiatry

## 2018-03-04 ENCOUNTER — Ambulatory Visit: Payer: No Typology Code available for payment source | Admitting: Family Medicine

## 2018-03-04 VITALS — BP 128/82 | HR 79 | Temp 98.2°F | Ht 59.0 in | Wt 144.2 lb

## 2018-03-04 DIAGNOSIS — H9193 Unspecified hearing loss, bilateral: Secondary | ICD-10-CM | POA: Diagnosis not present

## 2018-03-04 MED ORDER — PHENTERMINE HCL 37.5 MG PO TABS: 37.5000 mg | ORAL_TABLET | Freq: Every day | ORAL | 0 refills | Status: DC

## 2018-03-04 MED ORDER — TOPIRAMATE 25 MG PO TABS: 25.0000 mg | ORAL_TABLET | Freq: Two times a day (BID) | ORAL | 0 refills | Status: DC

## 2018-03-04 MED ORDER — LORCASERIN HCL 10 MG PO TABS: 1.0000 | ORAL_TABLET | Freq: Every morning | ORAL | 0 refills | Status: DC

## 2018-03-04 NOTE — Progress Notes (Signed)
Yatziry Deakins Arch is a 62 y.o. female is here for follow up.  History of Present Illness:   Shaune Pascal CMA acting as scribe for Dr. Juleen China.  HPI: Patient comes in today for her follow up. She has some decreased hearing and did not pass her hearing test. Will place a referral to ENT to check hearing.    There are no preventive care reminders to display for this patient. Depression screen PHQ 2/9 08/05/2017  Decreased Interest 0  Down, Depressed, Hopeless 0  PHQ - 2 Score 0   PMHx, SurgHx, SocialHx, FamHx, Medications, and Allergies were reviewed in the Visit Navigator and updated as appropriate.   Patient Active Problem List   Diagnosis Date Noted  . Morbid obesity ([place]) 12/04/2017  . Chronic pain of left knee 05/21/2017  . S/P gastric bypass 05/21/2017  . 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 Smoking status: Never Smoker  . Smokeless tobacco: Never Used  Substance Use Topics  . Alcohol use: Yes    Comment: OCCASSIONAL  . Drug use: No   Current Medications and Allergies:   Current Outpatient Medications:  .  Cyanocobalamin (VITAMIN B-12) 2500 MCG SUBL, Place under the tongue., Disp: , Rfl:  .  Ibuprofen-Famotidine (DUEXIS) 800-26.6 MG TABS, Take 1 tablet by mouth 2 (two) times daily., Disp: 9 tablet, Rfl: 0 .  LORazepam (ATIVAN) 0.5 MG tablet, Take 1 tablet (0.5 mg total) by mouth at bedtime., Disp: 30 tablet, Rfl: 1 .  Multiple Vitamin (MULTIVITAMIN) tablet, Take 1 tablet by mouth daily., Disp: , Rfl:  .  phentermine (ADIPEX-P) 37.5 MG tablet, Take 1 tablet (37.5 mg total) by mouth daily before breakfast., Disp: 30 tablet, Rfl: 2 .  terbinafine (LAMISIL) 250 MG tablet, Take 1 tablet (250 mg total) by mouth daily., Disp: 90 tablet, Rfl: 0 .  TROKENDI XR 50 MG CP24, TAKE 1 CAPSULE BY MOUTH ONCE DAILY, Disp: 30 capsule, Rfl: 0  No Known Allergies Review of Systems   Pertinent items are noted in the HPI. Otherwise, ROS is  negative.  Vitals:   Vitals:   03/04/18 1545  BP: 128/82  Pulse: 79  Temp: 98.2 F (36.8 C)  TempSrc: Oral  SpO2: 97%  Weight: 144 lb 3.2 oz (65.4 kg)  Height: 4\' 11"  (1.499 m)     Body mass index is 29.12 kg/m.  Physical Exam:   Physical Exam  Constitutional: She is oriented to person, place, and time. She appears well-developed and well-nourished. No distress.  HENT:  Head: Normocephalic and atraumatic.  Right Ear: External ear normal.  Left Ear: External ear normal.  Nose: Nose normal.  Mouth/Throat: Oropharynx is clear and moist.  Eyes: Pupils are equal, round, and reactive to light. Conjunctivae and EOM are normal.  Neck: Normal range of motion. Neck supple. No thyromegaly present.  Cardiovascular: Normal rate, regular rhythm, normal heart sounds and intact distal pulses.  Pulmonary/Chest: Effort normal and breath sounds normal.  Abdominal: Soft. Bowel sounds are normal.  Musculoskeletal: Normal range of motion.  Lymphadenopathy:    She has no cervical adenopathy.  Neurological: She is alert and oriented to person, place, and time.  Skin: Skin is warm and dry. Capillary refill takes less than 2 seconds.  Psychiatric: She has a normal mood and affect. Her behavior is normal.  Nursing note and vitals reviewed.  Assessment and Plan:   Diagnoses and all orders for this visit:  Decreased hearing of both  ears -     Ambulatory referral to ENT  Morbid obesity [facility]) Comments: She does not feel that Qsymia combo is working. Wants to change to different medication. Will wean phentermine and topamax. Trial Belviq.  Orders: -     Lorcaserin HCl (BELVIQ) 10 MG TABS; Take 1 tablet by mouth every morning. -     topiramate (TOPAMAX) 25 MG tablet; Take 1 tablet (25 mg total) by mouth 2 (two) times daily. -     phentermine (ADIPEX-P) 37.5 MG tablet; Take 1 tablet (37.5 mg total) by mouth daily before breakfast.    . Reviewed expectations re: course of current medical  issues. . Discussed self-management of symptoms. . Outlined signs and symptoms indicating need for more acute intervention. . Patient verbalized understanding and all questions were answered. Marland Kitchen Health Maintenance issues including appropriate healthy diet, exercise, and smoking avoidance were discussed with patient. . See orders for this visit as documented in the electronic medical record. . Patient received an After Visit Summary.  Briscoe Deutscher, DO [place], [facility] 03/08/2018  No future appointments.  CMA served as Education administrator during this visit. History, Physical, and Plan performed by medical provider. The above documentation has been reviewed and is accurate and complete. Briscoe Deutscher, D.O.

## 2018-03-06 ENCOUNTER — Telehealth: Payer: Self-pay

## 2018-03-06 NOTE — Telephone Encounter (Signed)
PA for Belviq approved through 06/04/2018.  Patient's pharmacy notified.

## 2018-03-08 ENCOUNTER — Encounter: Payer: Self-pay | Admitting: Family Medicine

## 2018-04-02 ENCOUNTER — Ambulatory Visit: Payer: No Typology Code available for payment source | Admitting: Family Medicine

## 2018-04-02 ENCOUNTER — Encounter: Payer: Self-pay | Admitting: Family Medicine

## 2018-04-02 VITALS — BP 126/80 | HR 78 | Temp 98.7°F | Ht 59.0 in | Wt 145.0 lb

## 2018-04-02 DIAGNOSIS — Z9884 Bariatric surgery status: Secondary | ICD-10-CM

## 2018-04-02 DIAGNOSIS — M47816 Spondylosis without myelopathy or radiculopathy, lumbar region: Secondary | ICD-10-CM

## 2018-04-02 DIAGNOSIS — Z114 Encounter for screening for human immunodeficiency virus [HIV]: Secondary | ICD-10-CM | POA: Diagnosis not present

## 2018-04-02 DIAGNOSIS — M898X1 Other specified disorders of bone, shoulder: Secondary | ICD-10-CM | POA: Diagnosis not present

## 2018-04-02 DIAGNOSIS — R635 Abnormal weight gain: Secondary | ICD-10-CM | POA: Diagnosis not present

## 2018-04-02 MED ORDER — TERBINAFINE HCL 250 MG PO TABS: 250.0000 mg | ORAL_TABLET | Freq: Every day | ORAL | 0 refills | Status: DC

## 2018-04-02 MED ORDER — PHENTERMINE HCL 8 MG PO TABS: 1.0000 | ORAL_TABLET | Freq: Three times a day (TID) | ORAL | 0 refills | Status: DC | PRN

## 2018-04-02 MED ORDER — TOPIRAMATE 25 MG PO TABS: 25.0000 mg | ORAL_TABLET | Freq: Two times a day (BID) | ORAL | 0 refills | Status: DC

## 2018-04-02 MED ORDER — MELOXICAM 15 MG PO TABS: 15.0000 mg | ORAL_TABLET | Freq: Every day | ORAL | 0 refills | Status: DC

## 2018-04-02 MED ORDER — CYCLOBENZAPRINE HCL 5 MG PO TABS: 5.0000 mg | ORAL_TABLET | Freq: Every day | ORAL | 0 refills | Status: DC

## 2018-04-02 NOTE — Progress Notes (Signed)
Shelly Reynolds is a 62 y.o. female here for an acute visit.  History of Present Illness:   Shelly Reynolds, CMA acting as scribe for Dr. Briscoe Reynolds.   HPI: Patient in for follow up on back pain. She has been seen by Dr. Ernestina Reynolds and received epidural that helped a lot. She wanted to know if she needs to go back for another injection or if you have another treatment option.   She also would like to talk about refill on her weight loss medication. She is currently taking Phentermine 3-4 times a week and the topamax daily. She denies any side effects. She is up one pound from last visit. She has been working on diet modification but is hard to exercise with back pain.   Needs one refill of Lamisil to complete course.   PMHx, SurgHx, SocialHx, Medications, and Allergies were reviewed in the Visit Navigator and updated as appropriate.  Current Medications:   .  Cyanocobalamin (VITAMIN B-12) 2500 MCG SUBL, Place under the tongue., Disp: , Rfl:  .  LORazepam (ATIVAN) 0.5 MG tablet, Take 1 tablet (0.5 mg total) by mouth at bedtime., Disp: 30 tablet, Rfl: 1 .  Lorcaserin HCl (BELVIQ) 10 MG TABS, Take 1 tablet by mouth every morning., Disp: 30 tablet, Rfl: 0 .  Multiple Vitamin (MULTIVITAMIN) tablet, Take 1 tablet by mouth daily., Disp: , Rfl:  .  phentermine (ADIPEX-P) 37.5 MG tablet, Take 1 tablet (37.5 mg total) by mouth daily before breakfast., Disp: 30 tablet, Rfl: 0 .  terbinafine (LAMISIL) 250 MG tablet, Take 1 tablet (250 mg total) by mouth daily., Disp: 90 tablet, Rfl: 0 .  topiramate (TOPAMAX) 25 MG tablet, Take 1 tablet (25 mg total) by mouth 2 (two) times daily., Disp: 60 tablet, Rfl: 0   No Known Allergies   Review of Systems:   Pertinent items are noted in the HPI. Otherwise, ROS is negative.  Vitals:   Vitals:   04/02/18 1430  BP: 126/80  Pulse: 78  Temp: 98.7 F (37.1 C)  TempSrc: Oral  SpO2: 98%  Weight: 145 lb (65.8 kg)  Height: 4\' 11"  (1.499 m)     Body mass  index is 29.29 kg/m.  Physical Exam:   Physical Exam  Constitutional: She appears well-nourished.  HENT:  Head: Normocephalic and atraumatic.  Eyes: Pupils are equal, round, and reactive to light. EOM are normal.  Neck: Normal range of motion. Neck supple.  Cardiovascular: Normal rate, regular rhythm, normal heart sounds and intact distal pulses.  Pulmonary/Chest: Effort normal.  Abdominal: Soft.  Musculoskeletal:       Right shoulder: She exhibits decreased range of motion and spasm.       Lumbar back: She exhibits decreased range of motion and spasm.       Back:       Arms: Skin: Skin is warm.  Psychiatric: She has a normal mood and affect. Her behavior is normal.  Nursing note and vitals reviewed.    Results for orders placed or performed in visit on 12/24/17  CBC with Differential  Result Value Ref Range   WBC 6.9 3.8 - 10.8 Thousand/uL   RBC 4.23 3.80 - 5.10 Million/uL   Hemoglobin 12.7 11.7 - 15.5 g/dL   HCT 37.2 35.0 - 45.0 %   MCV 87.9 80.0 - 100.0 fL   MCH 30.0 27.0 - 33.0 pg   MCHC 34.1 32.0 - 36.0 g/dL   RDW 12.6 11.0 - 15.0 %   Platelets  286 140 - 400 Thousand/uL   MPV 10.4 7.5 - 12.5 fL   Neutro Abs 4,106 1,500 - 7,800 cells/uL   Lymphs Abs 2,167 850 - 3,900 cells/uL   WBC mixed population 531 200 - 950 cells/uL   Eosinophils Absolute 48 15 - 500 cells/uL   Basophils Absolute 48 0 - 200 cells/uL   Neutrophils Relative % 59.5 %   Total Lymphocyte 31.4 %   Monocytes Relative 7.7 %   Eosinophils Relative 0.7 %   Basophils Relative 0.7 %  Hepatic Function Panel  Result Value Ref Range   Total Protein 6.2 6.1 - 8.1 g/dL   Albumin 4.1 3.6 - 5.1 g/dL   Globulin 2.1 1.9 - 3.7 g/dL (calc)   AG Ratio 2.0 1.0 - 2.5 (calc)   Total Bilirubin 0.3 0.2 - 1.2 mg/dL   Bilirubin, Direct 0.0 0.0 - 0.2 mg/dL   Indirect Bilirubin 0.3 0.2 - 1.2 mg/dL (calc)   Alkaline phosphatase (APISO) 99 33 - 130 U/L   AST 19 10 - 35 U/L   ALT 18 6 - 29 U/L    Assessment and  Plan:   Shelly Reynolds was seen today for back pain.  Diagnoses and all orders for this visit:  Periscapular pain Comments: Treatment as below. Provided exercises.  Orders: -     meloxicam (MOBIC) 15 MG tablet; Take 1 tablet (15 mg total) by mouth daily. -     cyclobenzaprine (FLEXERIL) 5 MG tablet; Take 1 tablet (5 mg total) by mouth at bedtime.  Morbid obesity ([place]) Comments: Belviq not helpful. Snacking later in the day. Will trial Phentermine 8 mg TID. Orders: -     topiramate (TOPAMAX) 25 MG tablet; Take 1 tablet (25 mg total) by mouth 2 (two) times daily. -     Lipid panel -     Phentermine HCl 8 MG TABS; Take 1 tablet by mouth 3 (three) times daily as needed.  Spondylosis of lumbar region without myelopathy or radiculopathy Comments: Back to PMR. Injections worked well. Orders: -     meloxicam (MOBIC) 15 MG tablet; Take 1 tablet (15 mg total) by mouth daily. -     cyclobenzaprine (FLEXERIL) 5 MG tablet; Take 1 tablet (5 mg total) by mouth at bedtime.  S/P gastric bypass -     CBC with Differential/Platelet -     Comprehensive metabolic panel  Weight gain -     CBC with Differential/Platelet -     Comprehensive metabolic panel -     TSH -     Phentermine HCl 8 MG TABS; Take 1 tablet by mouth 3 (three) times daily as needed.  Screening for HIV (human immunodeficiency virus) -     HIV antibody  Other orders -     terbinafine (LAMISIL) 250 MG tablet; Take 1 tablet (250 mg total) by mouth daily.    . Reviewed expectations re: course of current medical issues. . Discussed self-management of symptoms. . Outlined signs and symptoms indicating need for more acute intervention. . Patient verbalized understanding and all questions were answered. Shelly Reynolds Kitchen Health Maintenance issues including appropriate healthy diet, exercise, and smoking avoidance were discussed with patient. . See orders for this visit as documented in the electronic medical record. . Patient received an After  Visit Summary.  CMA served as Education administrator during this visit. History, Physical, and Plan performed by medical provider. The above documentation has been reviewed and is accurate and complete. Shelly Reynolds, D.O.  Shelly Deutscher, DO [place], [facility] 04/02/2018

## 2018-04-03 LAB — LIPID PANEL
Cholesterol: 193 mg/dL (ref 0–200)
HDL: 81.4 mg/dL (ref >39.00)
LDL Cholesterol: 91 mg/dL (ref 0–99)
NonHDL: 111.34
Total CHOL/HDL Ratio: 2
Triglycerides: 103 mg/dL (ref 0.0–149.0)
VLDL: 20.6 mg/dL (ref 0.0–40.0)

## 2018-04-03 LAB — CBC WITH DIFFERENTIAL/PLATELET
Basophils Absolute: 0.1 10*3/uL (ref 0.0–0.1)
Basophils Relative: 1 % (ref 0.0–3.0)
Eosinophils Absolute: 0.1 10*3/uL (ref 0.0–0.7)
Eosinophils Relative: 1.6 % (ref 0.0–5.0)
HCT: 37.2 % (ref 36.0–46.0)
Hemoglobin: 12.7 g/dL (ref 12.0–15.0)
Lymphocytes Relative: 31.2 % (ref 12.0–46.0)
Lymphs Abs: 1.8 10*3/uL (ref 0.7–4.0)
MCHC: 34.2 g/dL (ref 30.0–36.0)
MCV: 89.2 fl (ref 78.0–100.0)
Monocytes Absolute: 0.5 10*3/uL (ref 0.1–1.0)
Monocytes Relative: 8 % (ref 3.0–12.0)
Neutro Abs: 3.3 10*3/uL (ref 1.4–7.7)
Neutrophils Relative %: 58.2 % (ref 43.0–77.0)
Platelets: 250 10*3/uL (ref 150.0–400.0)
RBC: 4.17 Mil/uL (ref 3.87–5.11)
RDW: 13.8 % (ref 11.5–15.5)
WBC: 5.7 10*3/uL (ref 4.0–10.5)

## 2018-04-03 LAB — COMPREHENSIVE METABOLIC PANEL
ALT: 16 U/L (ref 0–35)
AST: 14 U/L (ref 0–37)
Albumin: 4.1 g/dL (ref 3.5–5.2)
Alkaline Phosphatase: 91 U/L (ref 39–117)
BUN: 18 mg/dL (ref 6–23)
CO2: 25 mEq/L (ref 19–32)
Calcium: 8.9 mg/dL (ref 8.4–10.5)
Chloride: 107 mEq/L (ref 96–112)
Creatinine, Ser: 0.73 mg/dL (ref 0.40–1.20)
GFR: 86.21 mL/min (ref >60.00)
Glucose, Bld: 79 mg/dL (ref 70–99)
Potassium: 4.3 mEq/L (ref 3.5–5.1)
Sodium: 140 mEq/L (ref 135–145)
Total Bilirubin: 0.3 mg/dL (ref 0.2–1.2)
Total Protein: 6.5 g/dL (ref 6.0–8.3)

## 2018-04-03 LAB — TSH: TSH: 2.51 u[IU]/mL (ref 0.35–4.50)

## 2018-04-03 LAB — HIV ANTIBODY (ROUTINE TESTING W REFLEX): HIV 1&2 Ab, 4th Generation: NONREACTIVE

## 2018-04-08 ENCOUNTER — Telehealth: Payer: Self-pay

## 2018-04-10 ENCOUNTER — Telehealth: Payer: Self-pay

## 2018-04-10 NOTE — Telephone Encounter (Signed)
Kevona Percifield Key: [id number] - PA Case ID: [id number] Need help? Call us at [phone]  Outcome  Approvedtoday  Your PA request has been approved. Additional information will be provided in the approval communication. (Message 1145)  DrugLomaira 8MG  tablets Called pharmacy to let them know.

## 2018-04-10 NOTE — Telephone Encounter (Signed)
Error

## 2018-04-14 NOTE — Telephone Encounter (Signed)
L/m with pharmacy to let them know approved.

## 2018-04-14 NOTE — Telephone Encounter (Signed)
auth sent aproved lomaira 8mg  from 03/11/17 to 07/09/18. Letter sent to scan.

## 2018-04-15 ENCOUNTER — Telehealth: Payer: Self-pay | Admitting: Family Medicine

## 2018-04-15 NOTE — Telephone Encounter (Signed)
Please see message. °

## 2018-04-15 NOTE — Telephone Encounter (Signed)
See note

## 2018-04-15 NOTE — Telephone Encounter (Signed)
Copied from [place] [id number]. Topic: General - Other >> Apr 15, 2018 10:56 AM Lennox Solders wrote: Reason for CRM: pt is calling and she would like to go back on phentermine. Pt can not afford lomaira. [facility] [address]

## 2018-04-16 ENCOUNTER — Other Ambulatory Visit: Payer: Self-pay | Admitting: Family Medicine

## 2018-04-16 NOTE — Telephone Encounter (Signed)
Called pharmacy spoke with Danton Clap changed to old script with 1 refill

## 2018-04-16 NOTE — Telephone Encounter (Signed)
Verbal given to pharmacy see note.

## 2018-04-16 NOTE — Telephone Encounter (Signed)
Okay to call pharmacy to go back to previous Rx.

## 2018-04-16 NOTE — Telephone Encounter (Signed)
Spoke to pt told her Rx for Phentermine was sent to pharmacy. Pt verbalized understanding.

## 2018-07-04 ENCOUNTER — Ambulatory Visit: Payer: No Typology Code available for payment source | Admitting: Family Medicine

## 2018-08-20 ENCOUNTER — Other Ambulatory Visit: Payer: Self-pay | Admitting: Family Medicine

## 2018-08-20 DIAGNOSIS — Z1231 Encounter for screening mammogram for malignant neoplasm of breast: Secondary | ICD-10-CM

## 2018-09-18 ENCOUNTER — Ambulatory Visit
Admission: RE | Admit: 2018-09-18 | Discharge: 2018-09-18 | Disposition: A | Discharge status: Home / Self Care | Payer: No Typology Code available for payment source | Source: Ambulatory Visit | Attending: Family Medicine | Admitting: Family Medicine

## 2018-09-18 DIAGNOSIS — Z1231 Encounter for screening mammogram for malignant neoplasm of breast: Secondary | ICD-10-CM

## 2018-10-03 ENCOUNTER — Ambulatory Visit: Payer: No Typology Code available for payment source

## 2019-01-20 ENCOUNTER — Ambulatory Visit: Payer: Self-pay

## 2019-01-20 NOTE — Telephone Encounter (Signed)
Pt is requesting a work note for her to stay out of work until May 15 th. 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 Pt was concerned about bring it home to her spouse who has COPD. E-mail verified.

## 2019-01-20 NOTE — Telephone Encounter (Signed)
Please call to set up video visit.

## 2019-01-20 NOTE — Telephone Encounter (Signed)
Virtual visit scheduled for 01/21/2019 at 4:20 PM.

## 2019-01-21 ENCOUNTER — Ambulatory Visit (INDEPENDENT_AMBULATORY_CARE_PROVIDER_SITE_OTHER): Payer: No Typology Code available for payment source | Admitting: Family Medicine

## 2019-01-21 ENCOUNTER — Telehealth: Payer: Self-pay

## 2019-01-21 ENCOUNTER — Encounter: Payer: Self-pay | Admitting: Family Medicine

## 2019-01-21 ENCOUNTER — Other Ambulatory Visit: Payer: Self-pay

## 2019-01-21 VITALS — Ht 59.0 in | Wt 150.0 lb

## 2019-01-21 DIAGNOSIS — Z9884 Bariatric surgery status: Secondary | ICD-10-CM | POA: Diagnosis not present

## 2019-01-21 DIAGNOSIS — R635 Abnormal weight gain: Secondary | ICD-10-CM

## 2019-01-21 DIAGNOSIS — Z9189 Other specified personal risk factors, not elsewhere classified: Secondary | ICD-10-CM | POA: Diagnosis not present

## 2019-01-21 MED ORDER — LIRAGLUTIDE -WEIGHT MANAGEMENT 18 MG/3ML ~~LOC~~ SOPN: PEN_INJECTOR | SUBCUTANEOUS | 0 refills | Status: AC

## 2019-01-21 NOTE — Progress Notes (Addendum)
Virtual Visit via Video   I connected with Shelly Reynolds by a video enabled telemedicine application and verified that I am speaking with the correct person using two identifiers. Location patient: Home Location provider: [facility], Office Persons participating in the virtual visit: Jleigh, [place], DO   I discussed the limitations of evaluation and management by telemedicine and the availability of in person appointments. The patient expressed understanding and agreed to proceed.  Subjective:   HPI: Patient has husband that has COPD. She is doing the grab and go lunches for schools. She would like letter to have her out of work due to husband.   She would like to work on weight loss. She has been on several different medications in the past. She is aware that she needs to work on diet and exercise. We offered appointment me with Aldona Bar but she did not want to make at this time. Recently she was on 37.5 phentermine one in the morning and 1/2 in the afternoon. She did not have any improvement with that. She would like to ger referral to Dr. Leafy Ro.   ROS: See pertinent positives and negatives per HPI.  Patient Active Problem List   Diagnosis Date Noted  . Morbid obesity ([place]) 12/04/2017  . Chronic pain of left knee 05/21/2017  . S/P gastric bypass 05/21/2017  . 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 Smoking status: Never Smoker  . Smokeless tobacco: Never Used  Substance Use Topics  . Alcohol use: Yes    Comment: OCCASSIONAL    Current Outpatient Medications:  .  Liraglutide -Weight Management (SAXENDA) 18 MG/3ML SOPN, Inject 0.6 mg into the skin daily for 7 days, THEN 1.2 mg daily for 7 days, THEN 1.8 mg daily for 7 days., Disp: 4 pen, Rfl: 0  No Known Allergies  Objective:   VITALS: Per patient if applicable, see vitals. GENERAL: Alert, appears well and in no acute distress. HEENT: Atraumatic, conjunctiva clear,  no obvious abnormalities on inspection of external nose and ears. NECK: Normal movements of the head and neck. CARDIOPULMONARY: No increased WOB. Speaking in clear sentences. I:E ratio WNL.  MS: Moves all visible extremities without noticeable abnormality. PSYCH: Pleasant and cooperative, well-groomed. Speech normal rate and rhythm. Affect is appropriate. Insight and judgement are appropriate. Attention is focused, linear, and appropriate.  NEURO: CN grossly intact. Oriented as arrived to appointment on time with no prompting. Moves both UE equally.  SKIN: No obvious lesions, wounds, erythema, or cyanosis noted on face or hands.  Assessment and Plan:   Minaal was seen today for follow-up.  Diagnoses and all orders for this visit:  Weight gain  Morbid obesity ([place]) -     Cancel: Amb Referral to Bariatric Surgery -     Liraglutide -Weight Management (SAXENDA) 18 MG/3ML SOPN; Inject 0.6 mg into the skin daily for 7 days, THEN 1.2 mg daily for 7 days, THEN 1.8 mg daily for 7 days. -     Amb Ref to Medical Weight Management  S/P gastric bypass  At risk for spreading communicable disease    . Reviewed expectations re: course of current medical issues. . Discussed self-management of symptoms. . Outlined signs and symptoms indicating need for more acute intervention. . Patient verbalized understanding and all questions were answered. Marland Kitchen Health Maintenance issues including appropriate healthy diet, exercise, and smoking avoidance were discussed with patient. . See orders for this visit as  documented in the electronic medical record.  Briscoe Deutscher, DO

## 2019-01-21 NOTE — Telephone Encounter (Signed)
[name] (Key: [id number])  Rx #: [id number]  Saxenda 18MG Fayne Mediate pen-injectors  Form [employer] Electronic PA Form (NCPDP)  Created  4 hours ago  Sent to Plan  6 minutes ago  Plan Response  6 minutes ago  Submit Clinical Questions  2 minutes ago  Determination

## 2019-01-22 ENCOUNTER — Telehealth: Payer: Self-pay

## 2019-01-22 NOTE — Telephone Encounter (Signed)
Initiated via [email or web address] 

## 2019-01-22 NOTE — Telephone Encounter (Signed)
Approved effective 01/21/2019 to 05/23/2019.

## 2019-01-22 NOTE — Telephone Encounter (Signed)
Gidget Flener (Key: [id number])  Rx #: [id number]  Saxenda 18MG /3ML pen-injectors   Message from Plan Your PA request has been approved. Additional information will be provided in the approval communication.

## 2019-01-25 ENCOUNTER — Encounter: Payer: Self-pay | Admitting: Family Medicine

## 2019-02-03 ENCOUNTER — Telehealth: Payer: Self-pay

## 2019-02-03 NOTE — Telephone Encounter (Signed)
-----   Message from Francella Solian, [place] sent at 01/22/2019  1:29 PM EDT ----- Regarding: letter Print letter and mail to home

## 2019-02-05 ENCOUNTER — Telehealth: Payer: Self-pay | Admitting: Family Medicine

## 2019-02-05 NOTE — Telephone Encounter (Signed)
Copied from [place] [id number]. Topic: General - Other >> Feb 05, 2019 12:19 PM Leward Quan A wrote: Reason for CRM: Patient called to ask to have a note faxed to her house for an extension to be out of work. She did not give a definite time line. But can be reached at  Ph / Fax # [phone]

## 2019-02-05 NOTE — Telephone Encounter (Signed)
Letter faxed.

## 2019-02-06 NOTE — Telephone Encounter (Signed)
OK to extend work note from 02/09/19-02/22/19?

## 2019-02-06 NOTE — Telephone Encounter (Signed)
Okay to extend note? 

## 2019-02-06 NOTE — Telephone Encounter (Signed)
Patient is calling back.  She is requesting another note to be sent to her employer with an extension to be  out of work.  She is requesting the dates on the letter to include 02/09/19-02/22/19. The note that was faxed yesterday had the same previous dates. The patient is requesting an extension. Can the note be faxed to [phone]? Thank you

## 2019-02-06 NOTE — Telephone Encounter (Signed)
See note

## 2019-02-09 NOTE — Telephone Encounter (Signed)
See note, PCP approved extension on 02/06/19 at 4:05pm per note  Copied from [place] #[id number]. Topic: General - Other >> Feb 09, 2019  9:04 AM Alanda Slim E wrote: Reason for CRM: Pt was written out of work for two weeks and letter expired on 4.24.2020. Pt is asking for a new letter to be faxed to her with an extension for two more weeks from April 27th/ Please fax to home number [phone] please advise

## 2019-02-09 NOTE — Telephone Encounter (Signed)
Called pt and confirmed receipt of work note. Pt confirmed. No further action required at this time.

## 2019-02-09 NOTE — Telephone Encounter (Signed)
Worked note faxed to pt at [phone].

## 2019-02-19 NOTE — Telephone Encounter (Signed)
See note

## 2019-02-19 NOTE — Telephone Encounter (Signed)
Pt need another extended work note for June 10 and please fax to [phone].Marland Kitchen please call and advise pt if this can be done.

## 2019-02-20 NOTE — Telephone Encounter (Signed)
Called pt and left VM to call the office.  

## 2019-02-20 NOTE — Telephone Encounter (Signed)
Okay for extension.

## 2019-02-20 NOTE — Telephone Encounter (Signed)
OK to extend work note through 03/25/19?

## 2019-02-20 NOTE — Telephone Encounter (Signed)
Note has been faxed to pt at [phone].

## 2019-04-01 ENCOUNTER — Other Ambulatory Visit: Payer: Self-pay

## 2019-04-01 ENCOUNTER — Ambulatory Visit
Admission: EM | Admit: 2019-04-01 | Discharge: 2019-04-01 | Disposition: A | Discharge status: Home / Self Care | Payer: BLUE CROSS/BLUE SHIELD | Attending: Emergency Medicine | Admitting: Emergency Medicine

## 2019-04-01 ENCOUNTER — Ambulatory Visit: Payer: Self-pay | Admitting: *Deleted

## 2019-04-01 ENCOUNTER — Encounter: Payer: Self-pay | Admitting: Emergency Medicine

## 2019-04-01 ENCOUNTER — Telehealth: Payer: Self-pay | Admitting: *Deleted

## 2019-04-01 ENCOUNTER — Ambulatory Visit: Payer: No Typology Code available for payment source | Admitting: Physician Assistant

## 2019-04-01 DIAGNOSIS — A084 Viral intestinal infection, unspecified: Secondary | ICD-10-CM | POA: Diagnosis not present

## 2019-04-01 DIAGNOSIS — Z20822 Contact with and (suspected) exposure to covid-19: Secondary | ICD-10-CM

## 2019-04-01 MED ORDER — ONDANSETRON HCL 4 MG PO TABS: 4.0000 mg | ORAL_TABLET | Freq: Four times a day (QID) | ORAL | 0 refills | Status: DC

## 2019-04-01 MED ORDER — ONDANSETRON 4 MG PO TBDP
4.0000 mg | ORAL_TABLET | Freq: Once | ORAL | Status: AC
  Administered 2019-04-01: 4 mg via ORAL

## 2019-04-01 NOTE — Telephone Encounter (Signed)
-----   Message from Lestine Box, [place] sent at 04/01/2019 12:23 PM EDT ----- Regarding: COVID testing GI symptoms with recent travel to [place].  Please contact patient on cell phone: [phone]

## 2019-04-01 NOTE — ED Triage Notes (Signed)
Pt describes stomach cramping that began last night, states that she vomited once and feels somewhat better just sore, also has c/o of headache, also describes having chill, no fever

## 2019-04-01 NOTE — Telephone Encounter (Signed)
See note

## 2019-04-01 NOTE — ED Provider Notes (Signed)
[place]   [id number] 04/01/19 Arrival Time: 5361  CC: ABDOMINAL cramping and nausea  SUBJECTIVE:  Shelly Reynolds is a 63 y.o. female hx significant for arthritis, depression GERD, HTN, urine incontinence, and S/P gastric bypass in 2009, who presents with complaint of abdominal cramping, nausea, 1 episode of vomiting this morning, and chills that began last night.  Does admit to eating a stuff pepper last night, also admits to recent travel to [place].  Denies diet changes, close contacts with similar symptoms, changes in medications or recent antibiotic use.  Complains of diffuse abdominal "soreness."  Has tried tried [employer] without relief.  Denies alleviating or aggravating factors.  Has been tolerating sips of water.  Denies similar symptoms in the past.  Last BM this morning and normal for patient.    Denies fever, vomiting, chest pain, SOB, diarrhea, constipation, hematochezia, melena, dysuria, difficulty urinating, increased frequency or urgency, flank pain, loss of bowel or bladder function.  Patient's last menstrual period was 01/23/2006 (exact date).  ROS: As per HPI.  Past Medical History:  Diagnosis Date  . Arthritis   . Depression   . GERD (gastroesophageal reflux disease)   . Hypertension   . Urine incontinence    Past Surgical History:  Procedure Laterality Date  . ABDOMINAL HYSTERECTOMY  2011  . BLADDER REPAIR    . BREAST REDUCTION SURGERY    . GASTRIC BYPASS    . REDUCTION MAMMAPLASTY Bilateral   . TONSILLECTOMY AND ADENOIDECTOMY Bilateral 1962  . TUBAL LIGATION    . TUMMY TUCK N/A    No Known Allergies No current facility-administered medications on file prior to encounter.    No current outpatient medications on file prior to encounter.   Social History   Socioeconomic History  . Marital status: Married    Spouse name: Not on file  . Number of children: Not on file  . Years of education: Not on file  . Highest education level: Not on file   Occupational History  . Not on file  Social Needs  . Financial resource strain: Not on file  . Food insecurity    Worry: Not on file    Inability: Not on file  . Transportation needs    Medical: Not on file    Non-medical: Not on file  Tobacco Use  . Smoking status: Never Smoker  . Smokeless tobacco: Never Used  Substance and Sexual Activity  . Alcohol use: Yes    Comment: OCCASSIONAL  . Drug use: No  . Sexual activity: Yes    Comment: HYSTERECTOMY  Lifestyle  . Physical activity    Days per week: Not on file    Minutes per session: Not on file  . Stress: Not on file  Relationships  . Social Herbalist on phone: Not on file    Gets together: Not on file    Attends religious service: Not on file    Active member of club or organization: Not on file    Attends meetings of clubs or organizations: Not on file    Relationship status: Not on file  . Intimate partner violence    Fear of current or ex partner: Not on file    Emotionally abused: Not on file    Physically abused: Not on file    Forced sexual activity: Not on file  Other Topics Concern  . Not on file  Social History Narrative  . Not on file   Family History  Problem Relation Age of Onset  . Arthritis Mother   . Cancer Mother   . Heart disease Mother   . Diabetes Mother   . Breast cancer Mother        46s  . Heart disease Father   . Hypertension Father   . Alcohol abuse Brother   . Alcohol abuse Maternal Uncle   . Alcohol abuse Maternal Grandmother   . Breast cancer Maternal Aunt 35     OBJECTIVE:  Vitals:   04/01/19 1200  BP: 134/88  Pulse: 79  Resp: 18  Temp: 97.7 F (36.5 C)  SpO2: 99%    General appearance: Alert; NAD HEENT: NCAT.  Oropharynx clear.  Lungs: clear to auscultation bilaterally without adventitious breath sounds Heart: regular rate and rhythm.  Radial pulses 2+ symmetrical bilaterally Abdomen: soft, non-distended; normal active bowel sounds; non-tender to light  or deep palpation; nontender at McBurney's point; negative Murphy's sign; no guarding Extremities: no edema; symmetrical with no gross deformities Skin: warm and dry Neurologic: normal gait Psychological: alert and cooperative; normal mood and affect  ASSESSMENT & PLAN:  1. Viral gastroenteritis   2. Suspected Covid-19 Virus Infection     Meds ordered this encounter  Medications  . ondansetron (ZOFRAN-ODT) disintegrating tablet 4 mg    Abdominal cramping/ nausea: Symptoms most likely related to a GI bug Get rest and drink fluids Zofran prescribed.  Take as directed.    DIET Instructions:  30 minutes after taking nausea medicine, begin with sips of clear liquids. If able to hold down 2 - 4 ounces for 30 minutes, begin drinking more. Increase your fluid intake to replace losses. Clear liquids only for 24 hours (water, tea, sport drinks, clear flat ginger ale or cola and juices, broth, jello, popsicles, ect). Advance to bland foods, applesauce, rice, baked or boiled chicken, ect. Avoid milk, greasy foods and anything that doesn't agree with you.  Follow up with PCP this week or next week for recheck and to ensure your symptoms are improving.  You may need to have blood work done If you experience new or worsening symptoms return or go to ER such as fever, chills, nausea, vomiting, diarrhea, bloody or dark tarry stools, constipation, urinary symptoms, worsening abdominal discomfort, symptoms that do not improve with medications, inability to keep fluids down, etc...  Concern for COVID:  COVID testing ordered.  Outpatient center will contact you regarding your appointment In the meantime: You should remain isolated in your home for 7 days from symptom onset AND greater than 72 hours after symptoms resolution (absence of fever without the use of fever-reducing medication and improvement in respiratory symptoms), whichever is longer Take OTC tylenol as needed for fever, body aches,  and/or chills Call or go to the ED if you have any new or worsening symptoms such as fever, worsening cough, shortness of breath, chest tightness, chest pain, turning blue, changes in mental status, etc...  Reviewed expectations re: course of current medical issues. Questions answered. Outlined signs and symptoms indicating need for more acute intervention. Patient verbalized understanding. After Visit Summary given.   Lestine Box, PA-C 04/01/19 1230

## 2019-04-01 NOTE — Discharge Instructions (Signed)
Abdominal cramping/ nausea: Symptoms most likely related to a GI bug Get rest and drink fluids Zofran prescribed.  Take as directed.    DIET Instructions:  30 minutes after taking nausea medicine, begin with sips of clear liquids. If able to hold down 2 - 4 ounces for 30 minutes, begin drinking more. Increase your fluid intake to replace losses. Clear liquids only for 24 hours (water, tea, sport drinks, clear flat ginger ale or cola and juices, broth, jello, popsicles, ect). Advance to bland foods, applesauce, rice, baked or boiled chicken, ect. Avoid milk, greasy foods and anything that doesnt agree with you.  Follow up with PCP this week or next week for recheck and to ensure your symptoms are improving.  You may need to have blood work done If you experience new or worsening symptoms return or go to ER such as fever, chills, nausea, vomiting, diarrhea, bloody or dark tarry stools, constipation, urinary symptoms, worsening abdominal discomfort, symptoms that do not improve with medications, inability to keep fluids down, etc...  Concern for COVID:  COVID testing ordered.  Outpatient center will contact you regarding your appointment In the meantime: You should remain isolated in your home for 7 days from symptom onset AND greater than 72 hours after symptoms resolution (absence of fever without the use of fever-reducing medication and improvement in respiratory symptoms), whichever is longer Take OTC tylenol as needed for fever, body aches, and/or chills Call or go to the ED if you have any new or worsening symptoms such as fever, worsening cough, shortness of breath, chest tightness, chest pain, turning blue, changes in mental status, etc..Marland Kitchen

## 2019-04-01 NOTE — Telephone Encounter (Signed)
Patient calls with upper mid abdominal pain And vomiting that started last night. Sharp intermittent pain that does not radiate.Tried tums. Also, having just drips of urine with voiding and frequency. No fever/diarrhea.Care Advice reviewed along with urgent symptoms to call for. Her husband has severe COPD so she questions if she should have covid testing. [place] on May 26 by car. No known positive contacts she reported. Transferred for virtual.  Reason for Disposition . [1] MODERATE pain (e.g., interferes with normal activities) AND [2] comes and goes (cramps) AND [3] present > 24 hours  (Exception: pain with Vomiting or Diarrhea - see that Guideline)  Answer Assessment - Initial Assessment Questions 1. LOCATION: "Where does it hurt?"      Upper mid abdominal pain 2. RADIATION: "Does the pain shoot anywhere else?" (e.g., chest, back)     Does not radiate 3. ONSET: "When did the pain begin?" (e.g., minutes, hours or days ago)      At bedtime last night 4. SUDDEN: "Gradual or sudden onset?"    Sudden sharp pain  5. PATTERN "Does the pain come and go, or is it constant?"    - If constant: "Is it getting better, staying the same, or worsening?"      (Note: Constant means the pain never goes away completely; most serious pain is constant and it progresses)     - If intermittent: "How [place] does it last?" "Do you have pain now?"     (Note: Intermittent means the pain goes away completely between bouts)    intermittent 6. SEVERITY: "How bad is the pain?"  (e.g., Scale 1-10; mild, moderate, or severe)    - MILD (1-3): doesn't interfere with normal activities, abdomen soft and not tender to touch     - MODERATE (4-7): interferes with normal activities or awakens from sleep, tender to touch     - SEVERE (8-10): excruciating pain, doubled over, unable to do any normal activities       7 7. RECURRENT SYMPTOM: "Have you ever had this type of abdominal pain before?" If so, ask: "When  was the last time?" and "What happened that time?"      Possibly years ago 8. AGGRAVATING FACTORS: "Does anything seem to cause this pain?" (e.g., foods, stress, alcohol)     no 9. CARDIAC SYMPTOMS: "Do you have any of the following symptoms: chest pain, difficulty breathing, sweating, nausea?"     Sweating all over when the sharp pain starts 10. OTHER SYMPTOMS: "Do you have any other symptoms?" (e.g., fever, vomiting, diarrhea)       Headache, bilateral ear discomfort 11. PREGNANCY: "Is there any chance you are pregnant?" "When was your last menstrual period?"       no  Protocols used: ABDOMINAL PAIN - UPPER-A-AH

## 2019-04-01 NOTE — Telephone Encounter (Signed)
Patient scheduled today at the [facility] @ 1:00. Instructions given and order placed

## 2019-04-04 LAB — NOVEL CORONAVIRUS, NAA: SARS-CoV-2, NAA: NOT DETECTED

## 2019-05-28 ENCOUNTER — Telehealth: Payer: Self-pay

## 2019-05-28 NOTE — Telephone Encounter (Signed)
Matteson Westenberger Key: [id number] - PA Case ID: [id number] Need help? Call us at [phone] Outcome Approvedtoday Your PA request has been approved. Additional information will be provided in the approval communication. (Message 1145) Drug Saxenda 18MG Fayne Mediate pen-injectors Form Charity fundraiser PA Form (NCPDP)

## 2019-07-17 ENCOUNTER — Telehealth: Payer: Self-pay

## 2019-07-17 NOTE — Telephone Encounter (Signed)
Please advise if you want me to use virtual app for in office or advise to see another provider?

## 2019-07-17 NOTE — Telephone Encounter (Signed)
Please advise.   Copied from [place] [id number]. Topic: General - Other >> Jul 17, 2019  1:14 PM Sheran Luz wrote: Patient would like to know if Dr. Juleen China would be willing to see her for an in person visit to discuss "lump" on inside of leg. Patient states this has been discussed previously. Patient would not like to schedule with anyone else, patient does not want virtual.

## 2019-07-19 NOTE — Telephone Encounter (Signed)
I can't see her until next week. Okay for virtual or in-office. She is referring to a lipoma on her left inner thigh. We got an ultrasound last year to confirm it. Benign. If she is wanting surgical intervention, okay to refer to General Surgery.

## 2019-07-20 ENCOUNTER — Other Ambulatory Visit: Payer: Self-pay

## 2019-07-20 DIAGNOSIS — D1724 Benign lipomatous neoplasm of skin and subcutaneous tissue of left leg: Secondary | ICD-10-CM

## 2019-07-20 NOTE — Telephone Encounter (Signed)
Called have made an referral to surgery. She would like to keep Virtual app coming up in order to talk about wt loss meds.

## 2019-07-20 NOTE — Telephone Encounter (Signed)
See note

## 2019-07-20 NOTE — Telephone Encounter (Signed)
Left message to return call to our office.  

## 2019-07-20 NOTE — Telephone Encounter (Signed)
Pec called office and Joellen going to return call, pt stated that she is at work and to call after 12:45 at [phone]

## 2019-07-28 ENCOUNTER — Telehealth: Payer: No Typology Code available for payment source | Admitting: Family Medicine

## 2019-07-28 ENCOUNTER — Encounter: Payer: Self-pay | Admitting: Family Medicine

## 2019-07-28 ENCOUNTER — Ambulatory Visit (INDEPENDENT_AMBULATORY_CARE_PROVIDER_SITE_OTHER): Payer: No Typology Code available for payment source | Admitting: Family Medicine

## 2019-07-28 DIAGNOSIS — Z9884 Bariatric surgery status: Secondary | ICD-10-CM

## 2019-07-28 DIAGNOSIS — D1724 Benign lipomatous neoplasm of skin and subcutaneous tissue of left leg: Secondary | ICD-10-CM | POA: Diagnosis not present

## 2019-07-28 MED ORDER — PHENTERMINE HCL 37.5 MG PO TABS: ORAL_TABLET | ORAL | 3 refills | Status: DC

## 2019-07-28 NOTE — Progress Notes (Signed)
Virtual Visit via Video   Due to the COVID-19 pandemic, this visit was completed with telemedicine (audio/video) technology to reduce patient and provider exposure as well as to preserve personal protective equipment.   I connected with Shelly Reynolds by a video enabled telemedicine application and verified that I am speaking with the correct person using two identifiers. Location patient: Home Location provider: [facility], Office Persons participating in the virtual visit: Supreet, Tim, DO   I discussed the limitations of evaluation and management by telemedicine and the availability of in person appointments. The patient expressed understanding and agreed to proceed.  Care Team   Patient Care Team: Briscoe Deutscher, DO as PCP - General (Family Medicine)  Subjective:   HPI: Lump on inside of left thigh. Has been there for over 2 yrs. She has a pulling feeling that has increased over time. She has had ultrasound in the past. Sometimes it is more uncomfortable than others. She is having more trouble when she is in bed.   Patient has always had issue with weight. She had gastric bypass in 2009. She is at heaviest weight she has been post op. She has not been exercising at all recently. She feels like her diet is not working.   Review of Systems  All other systems reviewed and are negative.   Patient Active Problem List   Diagnosis Date Noted  . Morbid obesity ([place]) 12/04/2017  . Chronic pain of left knee 05/21/2017  . S/P gastric bypass 05/21/2017  . 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 Smoking status: Never Smoker  . Smokeless tobacco: Never Used  Substance Use Topics  . Alcohol use: Yes    Comment: OCCASSIONAL    Current Outpatient Medications:  .  ibuprofen (ADVIL) 200 MG tablet, Take 200 mg by mouth once a week., Disp: , Rfl:  .  phentermine (ADIPEX-P) 37.5 MG tablet, One tab in am and 1/2 tab in afternoon.,  Disp: 45 tablet, Rfl: 3  No Known Allergies  Objective:   VITALS: Per patient if applicable, see vitals. GENERAL: Alert, appears well and in no acute distress. HEENT: Atraumatic, conjunctiva clear, no obvious abnormalities on inspection of external nose and ears. NECK: Normal movements of the head and neck. CARDIOPULMONARY: No increased WOB. Speaking in clear sentences. I:E ratio WNL.  MS: Moves all visible extremities without noticeable abnormality. PSYCH: Pleasant and cooperative, well-groomed. Speech normal rate and rhythm. Affect is appropriate. Insight and judgement are appropriate. Attention is focused, linear, and appropriate.  NEURO: CN grossly intact. Oriented as arrived to appointment on time with no prompting. Moves both UE equally.  SKIN: No obvious lesions, wounds, erythema, or cyanosis noted on face or hands.  Depression screen PHQ 2/9 08/05/2017  Decreased Interest 0  Down, Depressed, Hopeless 0  PHQ - 2 Score 0    Assessment and Plan:   Shelly Reynolds was seen today for follow-up.  Diagnoses and all orders for this visit:  Morbid obesity ([place]) -     phentermine (ADIPEX-P) 37.5 MG tablet; One tab in am and 1/2 tab in afternoon.  S/P gastric bypass  Lipoma of left lower extremity Comments: Will repeat referral to Surgery. Orders: -     Ambulatory referral to General Surgery   . COVID-19 Education: The signs and symptoms of COVID-19 were discussed with the patient and how to seek care for testing if needed. The importance of social distancing was discussed  today. . Reviewed expectations re: course of current medical issues. . Discussed self-management of symptoms. . Outlined signs and symptoms indicating need for more acute intervention. . Patient verbalized understanding and all questions were answered. Marland Kitchen Health Maintenance issues including appropriate healthy diet, exercise, and smoking avoidance were discussed with patient. . See orders for this visit as  documented in the electronic medical record.  Briscoe Deutscher, DO  Records requested if needed. Time spent: 25 minutes, of which >50% was spent in obtaining information about her symptoms, reviewing her previous labs, evaluations, and treatments, counseling her about her condition (please see the discussed topics above), and developing a plan to further investigate it; she had a number of questions which I addressed.

## 2019-07-29 ENCOUNTER — Encounter: Payer: Self-pay | Admitting: Family Medicine

## 2019-08-21 ENCOUNTER — Telehealth: Payer: Self-pay

## 2019-08-21 NOTE — Telephone Encounter (Signed)
Copied from [place] [id number]. Topic: Referral - Status >> Aug 18, 2019 12:37 PM Yvette Rack wrote: Reason for CRM: Pt stated Dr. Juleen China placed a referral to a surgeon however she has not heard from the surgeon's office for an appt. Pt requests call back >> Aug 21, 2019  4:12 PM Keene Breath wrote: Patient is calling again regarding a referral that should have been put in for a surgeon.  She still has not heard anything and it has been a few weeks.  Please advise and call patient to discuss at [phone]

## 2019-09-08 ENCOUNTER — Other Ambulatory Visit: Payer: Self-pay | Admitting: Physician Assistant

## 2019-09-08 DIAGNOSIS — Z1231 Encounter for screening mammogram for malignant neoplasm of breast: Secondary | ICD-10-CM

## 2019-09-18 ENCOUNTER — Ambulatory Visit: Payer: Self-pay | Admitting: Surgery

## 2019-09-18 NOTE — H&P (Signed)
Shelly Reynolds Documented: 09/18/2019 10:58 AM Location: [facility] Patient #: [id number] DOB: 04-26-1956 Married / Language: English / Race: White Female  History of Present Illness Shelly Reynolds Coin MD; 09/18/2019 11:36 AM) Patient words: Patient presents at the request of Dr. Briscoe Deutscher for a left medial thigh mass. Didn't present for many years. 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 The mass is located on her medial upper inner thigh. It's about the size of a golf ball. It does cause rubbing of the upper thigh discomfort. There is no redness or drainage at this summer got quite macerated. There is no foul smell.  The patient is a 63 year old female.   Past Surgical History (Shelly Reynolds, CMA; 09/18/2019 10:59 AM) Hysterectomy (not due to cancer) - Complete  Diagnostic Studies History (Shelly Reynolds, CMA; 09/18/2019 10:59 AM) Colonoscopy 5-10 years ago Mammogram within last year Pap Smear 1-5 years ago  Allergies (Shelly Reynolds, CMA; 09/18/2019 10:59 AM) No Known Drug Allergies [09/18/2019]:  Medication History (Shelly Reynolds, CMA; 09/18/2019 10:59 AM) No Current Medications Medications Reconciled  Social History (Shelly Reynolds, CMA; 09/18/2019 10:59 AM) Alcohol use Occasional alcohol use. Caffeine use Tea. No drug use Tobacco use Never smoker.  Family History (Shelly Reynolds, [place]; 09/18/2019 10:59 AM) Arthritis Father, Mother. Diabetes Mellitus Mother. Heart Disease Brother, Mother. Heart disease in female family member before age 67  Pregnancy / Birth History (Shelly Reynolds, [place]; 09/18/2019 10:59 AM) Age at menarche 75 years. Age of menopause 25-50 Gravida 3 Maternal age 81-20 Para 3  Other Problems (Shelly Reynolds, CMA; 09/18/2019 10:59 AM) Gastroesophageal Reflux Disease     Review of Systems (Shelly Reynolds CMA; 09/18/2019 10:59 AM) General Not Present- Appetite Loss, Chills,  Fatigue, Fever, Night Sweats, Weight Gain and Weight Loss. Skin Not Present- Change in Wart/Mole, Dryness, Hives, Jaundice, New Lesions, Non-Healing Wounds, Rash and Ulcer. HEENT Not Present- Earache, Hearing Loss, Hoarseness, Nose Bleed, Oral Ulcers, Ringing in the Ears, Seasonal Allergies, Sinus Pain, Sore Throat, Visual Disturbances, Wears glasses/contact lenses and Yellow Eyes. Respiratory Not Present- Bloody sputum, Chronic Cough, Difficulty Breathing, Snoring and Wheezing. Breast Not Present- Breast Mass, Breast Pain, Nipple Discharge and Skin Changes. Cardiovascular Not Present- Chest Pain, Difficulty Breathing Lying Down, Leg Cramps, Palpitations, Rapid Heart Rate, Shortness of Breath and Swelling of Extremities. Gastrointestinal Not Present- Abdominal Pain, Bloating, Bloody Stool, Change in Bowel Habits, Chronic diarrhea, Constipation, Difficulty Swallowing, Excessive gas, Gets full quickly at meals, Hemorrhoids, Indigestion, Nausea, Rectal Pain and Vomiting. Female Genitourinary Not Present- Frequency, Nocturia, Painful Urination, Pelvic Pain and Urgency. Musculoskeletal Not Present- Back Pain, Joint Pain, Joint Stiffness, Muscle Pain, Muscle Weakness and Swelling of Extremities. Neurological Not Present- Decreased Memory, Fainting, Headaches, Numbness, Seizures, Tingling, Tremor, Trouble walking and Weakness. Psychiatric Not Present- Anxiety, Bipolar, Change in Sleep Pattern, Depression, Fearful and Frequent crying. Endocrine Not Present- Cold Intolerance, Excessive Hunger, Hair Changes, Heat Intolerance, Hot flashes and New Diabetes. Hematology Not Present- Blood Thinners, Easy Bruising, Excessive bleeding, Gland problems, HIV and Persistent Infections.  Vitals (Shelly Reynolds CMA; 09/18/2019 10:59 AM) 09/18/2019 10:59 AM Weight: 156 lb Height: 59in Body Surface Area: 1.66 m Body Mass Index: 31.51 kg/m  Temp.: 68F(Tympanic)  Pulse: 73 (Regular)  BP: 140/90 (Sitting, Right  Arm, Standard)        Physical Exam (Champagne Paletta A. Arsenia Goracke MD; 09/18/2019 11:37 AM)  General Mental Status-Alert. General Appearance-Consistent with stated age. Hydration-Well hydrated. Voice-Normal.  Integumentary Note: Left  medial thigh is an oval-shaped 5 cm mobile mass on the upper inner aspect of her thigh. This is the subcutaneous tissue. Consistent with lipoma  Head and Neck Head-normocephalic, atraumatic with no lesions or palpable masses. Trachea-midline. Thyroid Gland Characteristics - normal size and consistency.  Eye Eyeball - Bilateral-Extraocular movements intact. Sclera/Conjunctiva - Bilateral-No scleral icterus.  Chest and Lung Exam Note: Worker breathing normal. No wheezing  Cardiovascular Note: NSR  Neurologic Neurologic evaluation reveals -alert and oriented x 3 with no impairment of recent or remote memory. Mental Status-Normal.  Musculoskeletal Normal Exam - Left-Upper Extremity Strength Normal and Lower Extremity Strength Normal. Normal Exam - Right-Upper Extremity Strength Normal and Lower Extremity Strength Normal.    Assessment & Plan (Reem Fleury A. 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 The patient would like to have excised due to irritation discomfort. Discussed risk of surgery to include but not exclusive of bleeding, infection, nerve injury, blood vessel injury, recurrence, cosmetic deformity, wound complications, and the need for other additional procedures. Also discussed the potential for new lipoma and recurrence of lipoma  Current Plans Pt Education - [facility] General Post-op HCI The pathophysiology of skin & subcutaneous masses was discussed. Natural history risks without surgery were discussed. I recommended surgery to remove the mass. I explained the technique of removal with use of local anesthesia & possible need for more aggressive  sedation/anesthesia for patient comfort.  Risks such as bleeding, infection, wound breakdown, heart attack, death, and other risks were discussed. I noted a good likelihood this will help address the problem. Possibility that this will not correct all symptoms was explained. Possibility of regrowth/recurrence of the mass was discussed. We will work to minimize complications. Questions were answered. The patient expresses understanding & wishes to proceed with surgery.

## 2019-10-17 ENCOUNTER — Encounter (HOSPITAL_COMMUNITY): Payer: Self-pay

## 2019-10-17 ENCOUNTER — Emergency Department (HOSPITAL_COMMUNITY)
Admission: EM | Admit: 2019-10-17 | Discharge: 2019-10-17 | Disposition: A | Discharge status: Home / Self Care | Payer: BLUE CROSS/BLUE SHIELD | Attending: Emergency Medicine | Admitting: Emergency Medicine

## 2019-10-17 ENCOUNTER — Other Ambulatory Visit: Payer: Self-pay

## 2019-10-17 DIAGNOSIS — M25519 Pain in unspecified shoulder: Secondary | ICD-10-CM | POA: Diagnosis not present

## 2019-10-17 DIAGNOSIS — M546 Pain in thoracic spine: Secondary | ICD-10-CM | POA: Diagnosis present

## 2019-10-17 DIAGNOSIS — Z79899 Other long term (current) drug therapy: Secondary | ICD-10-CM | POA: Diagnosis not present

## 2019-10-17 DIAGNOSIS — I10 Essential (primary) hypertension: Secondary | ICD-10-CM | POA: Diagnosis not present

## 2019-10-17 DIAGNOSIS — M62830 Muscle spasm of back: Secondary | ICD-10-CM

## 2019-10-17 MED ORDER — LIDOCAINE-EPINEPHRINE 2 %-1:100000 IJ SOLN: 20.0000 mL | Freq: Once | INTRAMUSCULAR | Status: DC

## 2019-10-17 MED ORDER — METHOCARBAMOL 500 MG PO TABS: 500.0000 mg | ORAL_TABLET | Freq: Four times a day (QID) | ORAL | 0 refills | Status: DC

## 2019-10-17 NOTE — ED Provider Notes (Signed)
[place] EMERGENCY DEPARTMENT Provider Note   CSN: [id number] Arrival date & time: 10/17/19  1018     History No chief complaint on file.   Shelly Reynolds is a 64 y.o. female who  has a past medical history of Arthritis, Depression, GERD (gastroesophageal reflux disease), Hypertension, and Urine incontinence. The patient presents with pain between her shoulder blades. She states that she was lifting many heavy boxes recently over the holiday period. She yesterday she was out when she had sudden onset of severe pain and tightness on between the spine and the R side of her shoulder blade ,then spreading to the opposite side. She has had trouble flexing her neck or raising her arms. She denies weakness or numbness of the upper extremities. She denies sob, cough, or hemoptysis.  HPI     Past Medical History:  Diagnosis Date  . Arthritis   . Depression   . GERD (gastroesophageal reflux disease)   . Hypertension   . Urine incontinence     Patient Active Problem List   Diagnosis Date Noted  . Morbid obesity ([place]) 12/04/2017  . Chronic pain of left knee 05/21/2017  . S/P gastric bypass 05/21/2017  . 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 ABDOMINAL HYSTERECTOMY  2011  . BLADDER REPAIR    . BREAST REDUCTION SURGERY    . GASTRIC BYPASS    . REDUCTION MAMMAPLASTY Bilateral   . TONSILLECTOMY AND ADENOIDECTOMY Bilateral 1962  . TUBAL LIGATION    . TUMMY TUCK N/A      OB History   No obstetric history on file.     Family History  Problem Relation Age of Onset  . Arthritis Mother   . Cancer Mother   . Heart disease Mother   . Diabetes Mother   . Breast cancer Mother        58s  . Heart disease Father   . Hypertension Father   . Alcohol abuse Brother   . Alcohol abuse Maternal Uncle   . Alcohol abuse Maternal Grandmother   . Breast cancer Maternal Aunt 32    Social History   Tobacco Use    . Smoking status: Never Smoker  . Smokeless tobacco: Never Used  Substance Use Topics  . Alcohol use: Yes    Comment: OCCASSIONAL  . Drug use: No    Home Medications Prior to Admission medications   Medication Sig Start Date End Date Taking? Authorizing Provider  ibuprofen (ADVIL) 200 MG tablet Take 200 mg by mouth once a week.    [provider]  phentermine (ADIPEX-P) 37.5 MG tablet One tab in am and 1/2 tab in afternoon. 07/28/19   Briscoe Deutscher, DO    Allergies    Patient has no known allergies.  Review of Systems   Review of Systems Ten systems reviewed and are negative for acute change, except as noted in the HPI.   Physical Exam Updated Vital Signs BP (!) 137/92   Pulse 82   Temp 98 F (36.7 C) (Oral)   Resp 12   LMP 01/23/2006 (Exact Date)   SpO2 99%   Physical Exam Vitals and nursing note reviewed.  Constitutional:      General: She is not in acute distress.    Appearance: She is well-developed. She is not diaphoretic.  HENT:     Head: Normocephalic and atraumatic.  Eyes:     General: No scleral icterus.  Conjunctiva/sclera: Conjunctivae normal.  Cardiovascular:     Rate and Rhythm: Normal rate and regular rhythm.     Heart sounds: Normal heart sounds. No murmur. No friction rub. No gallop.   Pulmonary:     Effort: Pulmonary effort is normal. No respiratory distress.     Breath sounds: Normal breath sounds.  Abdominal:     General: Bowel sounds are normal. There is no distension.     Palpations: Abdomen is soft. There is no mass.     Tenderness: There is no abdominal tenderness. There is no guarding.  Musculoskeletal:     Cervical back: Normal range of motion.     Thoracic back: Spasms and tenderness present. Decreased range of motion.       Back:  Skin:    General: Skin is warm and dry.  Neurological:     Mental Status: She is alert and oriented to person, place, and time.  Psychiatric:        Behavior: Behavior normal.     ED  Results / Procedures / Treatments   Labs (all labs ordered are listed, but only abnormal results are displayed) Labs Reviewed - No data to display  EKG None  Radiology No results found.  Procedures Procedures (including critical care time)  TRIGGER POINT INJECTION PROCEDURE NOTE Procedure authorized and performed by: Ned Grace   Patient given Lidocine 2% diluted with 10 ml Saline injection in miultiple trigger points of the upper back musculature. Patient consents verbally to this after risk and benefits discussed.   Procedure: With chlorhexidine prep of the thoracic upper musculature.  Palpable firm area of tenderness consistent with trigger point is localized. It was approached with 25-gauge. Tolerated this with some discomfort  with immediate improvement in  ROM and pain.     Medications Ordered in ED Medications - No data to display  ED Course  I have reviewed the triage vital signs and the nursing notes.  Pertinent labs & imaging results that were available during my care of the patient were reviewed by me and considered in my medical decision making (see chart for details).    MDM Rules/Calculators/A&P                      64 year old female here with acute upper back spasm.  Patient agreed to trigger point injections with significant improvement in her pain and range of motion.  I doubt radiculopathy or myelopathy.  She has no upper extremity weakness or paresthesia.  All of the pain is reproducible with palpation of the musculature between the shoulder blades.  Suggest outpatient dry needling, muscle relaxers, heat and ice, lidocaine patches.  Do not feel that there is any emergent cause of her symptoms.  Patient appears appropriate for discharge at this time.  I discussed return precautions. Final Clinical Impression(s) / ED Diagnoses Final diagnoses:  Trigger point of shoulder region, unspecified laterality  Muscle spasm of back    Rx / DC Orders ED  Discharge Orders    None       Margarita Mail, PA-C 10/17/19 1629    Fredia Sorrow, MD 10/25/19 [phone]

## 2019-10-17 NOTE — ED Triage Notes (Signed)
Patient complains of ongoing neck pain after seen at urgent care for same, denies trauma. Took muscle relaxer with some relief.

## 2019-10-17 NOTE — Discharge Instructions (Addendum)
I recommend that you get dry needling done for your back.  This may be done at any physical therapy center.  Most physical therapy centers except self-referral.  A simple Google search for dry needling in [place] will give you a multitude of options that you may call and schedule appointment set.  [facility] does offer this service however you will need a physician referral.

## 2019-10-19 ENCOUNTER — Ambulatory Visit: Payer: No Typology Code available for payment source | Admitting: Physician Assistant

## 2019-10-19 ENCOUNTER — Encounter: Payer: Self-pay | Admitting: Family Medicine

## 2019-10-19 ENCOUNTER — Other Ambulatory Visit: Payer: Self-pay | Admitting: Physician Assistant

## 2019-10-19 ENCOUNTER — Ambulatory Visit: Payer: No Typology Code available for payment source | Admitting: Family Medicine

## 2019-10-19 ENCOUNTER — Ambulatory Visit (INDEPENDENT_AMBULATORY_CARE_PROVIDER_SITE_OTHER): Payer: No Typology Code available for payment source | Admitting: Family Medicine

## 2019-10-19 ENCOUNTER — Ambulatory Visit (INDEPENDENT_AMBULATORY_CARE_PROVIDER_SITE_OTHER)
Admission: RE | Admit: 2019-10-19 | Discharge: 2019-10-19 | Disposition: A | Discharge status: Home / Self Care | Payer: No Typology Code available for payment source | Source: Ambulatory Visit | Attending: Family Medicine | Admitting: Family Medicine

## 2019-10-19 ENCOUNTER — Other Ambulatory Visit: Payer: Self-pay

## 2019-10-19 VITALS — BP 160/100 | HR 73 | Ht 59.0 in | Wt 153.6 lb

## 2019-10-19 DIAGNOSIS — M546 Pain in thoracic spine: Secondary | ICD-10-CM

## 2019-10-19 DIAGNOSIS — M898X1 Other specified disorders of bone, shoulder: Secondary | ICD-10-CM

## 2019-10-19 NOTE — Progress Notes (Deleted)
   Subjective:    I'm seeing this patient as a consultation for:  Shelly Reynolds. Note will be routed back to referring provider/PCP.  CC: Thoracic pain  I, Wendy Poet, LAT, ATC, am serving as scribe for Dr. Lynne Leader.  HPI: Pt is a 64 y/o female presenting w/ c/o thoracic pain.  She was seen at the [facility] ED on 10/17/19 for pain between her scapulae that she felt was attributed to lifting heavy boxes during the holidays.  Aggravating factors include cervical flexion and attempting to raise her arms above shoulder level.  She received trigger point injections at the ED and was prescribed Robaxin.  Since her visit to the ED, pt notes  Past medical history, Surgical history, Family history not pertinant except as noted below, Social history, Allergies, and medications have been entered into the medical record, reviewed, and no changes needed.   Review of Systems: No headache, visual changes, nausea, vomiting, diarrhea, constipation, dizziness, abdominal pain, skin rash, fevers, chills, night sweats, weight loss, swollen lymph nodes, body aches, joint swelling, muscle aches, chest pain, shortness of breath, mood changes, visual or auditory hallucinations.   Objective:   There were no vitals filed for this visit. General: Well Developed, well nourished, and in no acute distress.  Neuro/Psych: Alert and oriented x3, extra-ocular muscles intact, able to move all 4 extremities, sensation grossly intact. Skin: Warm and dry, no rashes noted.  Respiratory: Not using accessory muscles, speaking in full sentences, trachea midline.  Cardiovascular: Pulses palpable, no extremity edema. Abdomen: Does not appear distended. MSK: ***  Lab and Radiology Results No results found for this or any previous visit (from the past 72 hour(s)). No results found.  Impression and Recommendations:    Assessment and Plan: 64 y.o. female with ***.  PDMP not reviewed this encounter. No orders of the defined types  were placed in this encounter.  No orders of the defined types were placed in this encounter.   Discussed warning signs or symptoms. Please see discharge instructions. Patient expresses understanding.   ***

## 2019-10-19 NOTE — Patient Instructions (Signed)
Thank you for coming in today. Attend PT.  Use heating pad and TENS unit.  Recheck if not improving or if worse.   TENS UNIT: This is helpful for muscle pain and spasm.   Search and Purchase a TENS 7000 2nd edition at  www.tenspros.com or www.[employer].com It should be less than $30.     TENS unit instructions: Do not shower or bathe with the unit on Turn the unit off before removing electrodes or batteries If the electrodes lose stickiness add a drop of water to the electrodes after they are disconnected from the unit and place on plastic sheet. If you continued to have difficulty, call the TENS unit company to purchase more electrodes. Do not apply lotion on the skin area prior to use. Make sure the skin is clean and dry as this will help prolong the life of the electrodes. After use, always check skin for unusual red areas, rash or other skin difficulties. If there are any skin problems, does not apply electrodes to the same area. Never remove the electrodes from the unit by pulling the wires. Do not use the TENS unit or electrodes other than as directed. Do not change electrode placement without consultating your therapist or physician. Keep 2 fingers with between each electrode. Wear time ratio is 2:1, on to off times.    For example on for 30 minutes off for 15 minutes and then on for 30 minutes off for 15 minutes

## 2019-10-19 NOTE — Progress Notes (Signed)
I, Shelly Reynolds, LAT, ATC, am serving as scribe for Dr. Lynne Leader.  Shelly Reynolds is a 64 y.o. female who presents to [facility] [facility] today for thoracic pain.  Pt was most recently seen at the [facility] ED on 10/17/19 w/ c/o thoracic pain and pain between her scapulae that she felt was due to picking up heavier boxes during the holidays.  Aggravating factors include cervical flexion and elevating her arms above shoulder level.  She was given trigger point injections and prescribed Methocarbamol 500mg .  Since her visit to the ED, pt reports burning pain in her R superior-medial scapula and notes no improvement in her symptoms since her visit to the ED.  Aggravating factors include cervical ROM and active shoulder ROM above her head.  Pt states that she is taking the methocarbamol intermittently but doesn't want to take it when she's working.  She denies any radiating pain or numbness/tingling into her B UEs.     ROS:  As above  Exam:  BP (!) 160/100 (BP Location: Left Arm, Patient Position: Sitting, Cuff Size: Normal)   Pulse 73   Ht 4\' 11"  (1.499 m)   Wt 153 lb 9.6 oz (69.7 kg)   LMP 01/23/2006 (Exact Date)   SpO2 97%   BMI 31.02 kg/m  Wt Readings from Last 5 Encounters:  10/19/19 153 lb 9.6 oz (69.7 kg)  07/28/19 150 lb (68 kg)  01/21/19 150 lb (68 kg)  04/02/18 145 lb (65.8 kg)  03/04/18 144 lb 3.2 oz (65.4 kg)   General: Well Developed, well nourished, and in no acute distress.  Neuro/Psych: Alert and oriented x3, extra-ocular muscles intact, able to move all 4 extremities, sensation grossly intact. Skin: Warm and dry, no rashes noted.  Respiratory: Not using accessory muscles, speaking in full sentences, trachea midline.  Cardiovascular: Pulses palpable, no extremity edema. Abdomen: Does not appear distended. MSK:  C-spine: Normal-appearing nontender to midline normal cervical motion Extremity strength is intact.  Reflexes equal normal  bilaterally. T-spine: Normal-appearing Nontender to midline.  Tender palpation bilateral periscapular musculature. Pain reproduced with resisted scapular retraction Chest expansion is symmetrical.    Lab and Radiology Results 2 view chest x-ray images obtained today personally and independently reviewed No pneumothorax. No acute fractures of T-spine. Mild degenerative changes T-spine present. Await formal radiology review    Assessment and Plan: 65 y.o. female with several day history of periscapular pain due to muscle spasm and dysfunction.  Chest x-ray per my read largely normal today.  Plan for trial of physical therapy.  Recheck back with me in a few weeks if not improving.  Precautions reviewed and return sooner if needed.   PDMP not reviewed this encounter. Orders Placed This Encounter  Procedures  . DG Chest 2 View    Standing Status:   Future    Number of Occurrences:   1    Standing Expiration Date:   12/16/2020    Order Specific Question:   Reason for Exam (SYMPTOM  OR DIAGNOSIS REQUIRED)    Answer:   eval throacic back pain. Had thoracic back trigger point injection recently    Order Specific Question:   Preferred imaging location?    Answer:   [facility]    Order Specific Question:   Radiology Contrast Protocol - do NOT remove file path    Answer:   [email or web address]  . Ambulatory referral to Physical Therapy    Referral Priority:   Routine  Referral Type:   Physical Medicine    Referral Reason:   Specialty Services Required    Requested Specialty:   Physical Therapy   No orders of the defined types were placed in this encounter.   Historical information moved to improve visibility of documentation.  Past Medical History:  Diagnosis Date  . Arthritis   . Depression   . GERD (gastroesophageal reflux disease)   . Hypertension   . Urine incontinence    Past Surgical History:  Procedure Laterality Date  .  ABDOMINAL HYSTERECTOMY  2011  . BLADDER REPAIR    . BREAST REDUCTION SURGERY    . GASTRIC BYPASS    . REDUCTION MAMMAPLASTY Bilateral   . TONSILLECTOMY AND ADENOIDECTOMY Bilateral 1962  . TUBAL LIGATION    . TUMMY TUCK N/A    Social History   Tobacco Use  . Smoking status: Never Smoker  . Smokeless tobacco: Never Used  Substance Use Topics  . Alcohol use: Yes    Comment: OCCASSIONAL   family history includes Alcohol abuse in her brother, maternal grandmother, and maternal uncle; Arthritis in her mother; Breast cancer in her mother; Breast cancer (age of onset: 49) in her maternal aunt; Cancer in her mother; Diabetes in her mother; Heart disease in her father and mother; Hypertension in her father.  Medications: Current Outpatient Medications  Medication Sig Dispense Refill  . ibuprofen (ADVIL) 200 MG tablet Take 200 mg by mouth once a week.    . methocarbamol (ROBAXIN) 500 MG tablet Take 1 tablet (500 mg total) by mouth 4 (four) times daily. 20 tablet 0  . phentermine (ADIPEX-P) 37.5 MG tablet One tab in am and 1/2 tab in afternoon. 45 tablet 3   No current facility-administered medications for this visit.   No Known Allergies    Discussed warning signs or symptoms. Please see discharge instructions. Patient expresses understanding.  The above documentation has been reviewed and is accurate and complete Lynne Leader

## 2019-10-20 NOTE — Progress Notes (Signed)
Chest x-ray looks normal with no punctured lung or significant abnormalities with thoracic spine.

## 2019-10-21 ENCOUNTER — Encounter (HOSPITAL_BASED_OUTPATIENT_CLINIC_OR_DEPARTMENT_OTHER): Payer: Self-pay | Admitting: Surgery

## 2019-10-21 ENCOUNTER — Other Ambulatory Visit: Payer: Self-pay

## 2019-10-23 ENCOUNTER — Encounter (HOSPITAL_BASED_OUTPATIENT_CLINIC_OR_DEPARTMENT_OTHER)
Admission: RE | Admit: 2019-10-23 | Discharge: 2019-10-23 | Disposition: A | Discharge status: Home / Self Care | Payer: BLUE CROSS/BLUE SHIELD | Source: Ambulatory Visit | Attending: Surgery | Admitting: Surgery

## 2019-10-23 ENCOUNTER — Other Ambulatory Visit (HOSPITAL_COMMUNITY)
Admission: RE | Admit: 2019-10-23 | Discharge: 2019-10-23 | Disposition: A | Discharge status: Home / Self Care | Payer: BLUE CROSS/BLUE SHIELD | Source: Ambulatory Visit | Attending: Surgery | Admitting: Surgery

## 2019-10-23 ENCOUNTER — Other Ambulatory Visit: Payer: Self-pay

## 2019-10-23 DIAGNOSIS — Z20822 Contact with and (suspected) exposure to covid-19: Secondary | ICD-10-CM | POA: Insufficient documentation

## 2019-10-23 DIAGNOSIS — Z01812 Encounter for preprocedural laboratory examination: Secondary | ICD-10-CM | POA: Diagnosis present

## 2019-10-23 LAB — CBC WITH DIFFERENTIAL/PLATELET
Abs Immature Granulocytes: 0.02 10*3/uL (ref 0.00–0.07)
Basophils Absolute: 0 10*3/uL (ref 0.0–0.1)
Basophils Relative: 1 %
Eosinophils Absolute: 0.1 10*3/uL (ref 0.0–0.5)
Eosinophils Relative: 1 %
HCT: 42.2 % (ref 36.0–46.0)
Hemoglobin: 13.8 g/dL (ref 12.0–15.0)
Immature Granulocytes: 0 %
Lymphocytes Relative: 28 %
Lymphs Abs: 1.8 10*3/uL (ref 0.7–4.0)
MCH: 29.7 pg (ref 26.0–34.0)
MCHC: 32.7 g/dL (ref 30.0–36.0)
MCV: 90.8 fL (ref 80.0–100.0)
Monocytes Absolute: 0.4 10*3/uL (ref 0.1–1.0)
Monocytes Relative: 7 %
Neutro Abs: 4.2 10*3/uL (ref 1.7–7.7)
Neutrophils Relative %: 63 %
Platelets: 288 10*3/uL (ref 150–400)
RBC: 4.65 MIL/uL (ref 3.87–5.11)
RDW: 12.9 % (ref 11.5–15.5)
WBC: 6.6 10*3/uL (ref 4.0–10.5)
nRBC: 0 % (ref 0.0–0.2)

## 2019-10-23 LAB — COMPREHENSIVE METABOLIC PANEL
ALT: 24 U/L (ref 0–44)
AST: 23 U/L (ref 15–41)
Albumin: 3.9 g/dL (ref 3.5–5.0)
Alkaline Phosphatase: 94 U/L (ref 38–126)
Anion gap: 9 (ref 5–15)
BUN: 22 mg/dL (ref 8–23)
CO2: 22 mmol/L (ref 22–32)
Calcium: 9.2 mg/dL (ref 8.9–10.3)
Chloride: 108 mmol/L (ref 98–111)
Creatinine, Ser: 0.74 mg/dL (ref 0.44–1.00)
GFR calc Af Amer: >60 mL/min (ref >60)
GFR calc non Af Amer: >60 mL/min (ref >60)
Glucose, Bld: 124 mg/dL — ABNORMAL HIGH (ref 70–99)
Potassium: 4.6 mmol/L (ref 3.5–5.1)
Sodium: 139 mmol/L (ref 135–145)
Total Bilirubin: 0.6 mg/dL (ref 0.3–1.2)
Total Protein: 6.5 g/dL (ref 6.5–8.1)

## 2019-10-23 MED ORDER — CHLORHEXIDINE GLUCONATE CLOTH 2 % EX PADS: 6.0000 | MEDICATED_PAD | Freq: Once | CUTANEOUS | Status: DC

## 2019-10-23 NOTE — Progress Notes (Signed)

## 2019-10-24 LAB — NOVEL CORONAVIRUS, NAA (HOSP ORDER, SEND-OUT TO REF LAB; TAT 18-24 HRS): SARS-CoV-2, NAA: NOT DETECTED

## 2019-10-26 NOTE — Anesthesia Preprocedure Evaluation (Addendum)
Anesthesia Evaluation  Patient identified by MRN, date of birth, ID band Patient awake    Reviewed: Allergy & Precautions, NPO status , Patient's Chart, lab work & pertinent test results  Airway Mallampati: II  TM Distance: >3 FB Neck ROM: Full    Dental no notable dental hx. (+) Teeth Intact, Dental Advisory Given   Pulmonary sleep apnea ,    Pulmonary exam normal breath sounds clear to auscultation       Cardiovascular Exercise Tolerance: Good hypertension, Pt. on medications Normal cardiovascular exam Rhythm:Regular Rate:Normal     Neuro/Psych negative neurological ROS  negative psych ROS   GI/Hepatic Neg liver ROS, GERD  ,  Endo/Other  negative endocrine ROS  Renal/GU negative Renal ROS     Musculoskeletal   Abdominal (+) + obese,   Peds  Hematology   Anesthesia Other Findings   Reproductive/Obstetrics                            Anesthesia Physical Anesthesia Plan  ASA: II  Anesthesia Plan: General   Post-op Pain Management:    Induction: Intravenous  PONV Risk Score and Plan: Treatment may vary due to age or medical condition  Airway Management Planned: LMA  Additional Equipment: None  Intra-op Plan:   Post-operative Plan:   Informed Consent: I have reviewed the patients History and Physical, chart, labs and discussed the procedure including the risks, benefits and alternatives for the proposed anesthesia with the patient or authorized representative who has indicated his/her understanding and acceptance.     Dental advisory given  Plan Discussed with:   Anesthesia Plan Comments:        Anesthesia Quick Evaluation

## 2019-10-27 ENCOUNTER — Encounter (HOSPITAL_BASED_OUTPATIENT_CLINIC_OR_DEPARTMENT_OTHER): Payer: Self-pay | Admitting: Surgery

## 2019-10-27 ENCOUNTER — Ambulatory Visit (HOSPITAL_BASED_OUTPATIENT_CLINIC_OR_DEPARTMENT_OTHER): Payer: BLUE CROSS/BLUE SHIELD | Admitting: Anesthesiology

## 2019-10-27 ENCOUNTER — Encounter (HOSPITAL_BASED_OUTPATIENT_CLINIC_OR_DEPARTMENT_OTHER)
Admission: RE | Disposition: A | Discharge status: Home / Self Care | Payer: Self-pay | Source: Home / Self Care | Attending: Surgery

## 2019-10-27 ENCOUNTER — Other Ambulatory Visit: Payer: Self-pay

## 2019-10-27 ENCOUNTER — Ambulatory Visit (HOSPITAL_BASED_OUTPATIENT_CLINIC_OR_DEPARTMENT_OTHER)
Admission: RE | Admit: 2019-10-27 | Discharge: 2019-10-27 | Disposition: A | Discharge status: Home / Self Care | Payer: BLUE CROSS/BLUE SHIELD | Attending: Surgery | Admitting: Surgery

## 2019-10-27 DIAGNOSIS — Z9884 Bariatric surgery status: Secondary | ICD-10-CM | POA: Diagnosis not present

## 2019-10-27 DIAGNOSIS — R2242 Localized swelling, mass and lump, left lower limb: Secondary | ICD-10-CM | POA: Diagnosis present

## 2019-10-27 DIAGNOSIS — G4730 Sleep apnea, unspecified: Secondary | ICD-10-CM | POA: Diagnosis not present

## 2019-10-27 DIAGNOSIS — I10 Essential (primary) hypertension: Secondary | ICD-10-CM | POA: Insufficient documentation

## 2019-10-27 DIAGNOSIS — D1724 Benign lipomatous neoplasm of skin and subcutaneous tissue of left leg: Secondary | ICD-10-CM | POA: Diagnosis not present

## 2019-10-27 HISTORY — DX: Sleep apnea, unspecified: G47.30

## 2019-10-27 HISTORY — PX: LIPOMA EXCISION: SHX5283

## 2019-10-27 SURGERY — EXCISION LIPOMA
Anesthesia: General | Site: Thigh | Laterality: Left

## 2019-10-27 MED ORDER — GABAPENTIN 300 MG PO CAPS
300.0000 mg | ORAL_CAPSULE | ORAL | Status: AC
  Administered 2019-10-27: 300 mg via ORAL

## 2019-10-27 MED ORDER — CEFAZOLIN SODIUM-DEXTROSE 2-4 GM/100ML-% IV SOLN
INTRAVENOUS | Status: AC
  Filled 2019-10-27: qty 100

## 2019-10-27 MED ORDER — LIDOCAINE-EPINEPHRINE 2 %-1:100000 IJ SOLN
INTRAMUSCULAR | Status: AC
  Filled 2019-10-27: qty 1

## 2019-10-27 MED ORDER — PROPOFOL 10 MG/ML IV BOLUS
INTRAVENOUS | Status: AC
  Filled 2019-10-27: qty 40

## 2019-10-27 MED ORDER — HYDROCODONE-ACETAMINOPHEN 5-325 MG PO TABS: 1.0000 | ORAL_TABLET | Freq: Four times a day (QID) | ORAL | 0 refills | Status: DC | PRN

## 2019-10-27 MED ORDER — ONDANSETRON HCL 4 MG/2ML IJ SOLN
INTRAMUSCULAR | Status: DC | PRN
  Administered 2019-10-27: 4 mg via INTRAVENOUS

## 2019-10-27 MED ORDER — ONDANSETRON HCL 4 MG/2ML IJ SOLN: 4.0000 mg | Freq: Once | INTRAMUSCULAR | Status: DC | PRN

## 2019-10-27 MED ORDER — LIDOCAINE-EPINEPHRINE 1 %-1:100000 IJ SOLN
INTRAMUSCULAR | Status: AC
  Filled 2019-10-27: qty 1

## 2019-10-27 MED ORDER — CELECOXIB 200 MG PO CAPS
ORAL_CAPSULE | ORAL | Status: AC
  Filled 2019-10-27: qty 1

## 2019-10-27 MED ORDER — LIDOCAINE 2% (20 MG/ML) 5 ML SYRINGE
INTRAMUSCULAR | Status: DC | PRN
  Administered 2019-10-27: 100 mg via INTRAVENOUS

## 2019-10-27 MED ORDER — LIDOCAINE-EPINEPHRINE 0.5 %-1:200000 IJ SOLN
INTRAMUSCULAR | Status: AC
  Filled 2019-10-27: qty 1

## 2019-10-27 MED ORDER — DEXAMETHASONE SODIUM PHOSPHATE 10 MG/ML IJ SOLN
INTRAMUSCULAR | Status: AC
  Filled 2019-10-27: qty 1

## 2019-10-27 MED ORDER — CEFAZOLIN SODIUM-DEXTROSE 2-4 GM/100ML-% IV SOLN
2.0000 g | INTRAVENOUS | Status: AC
  Administered 2019-10-27: 2 g via INTRAVENOUS

## 2019-10-27 MED ORDER — LACTATED RINGERS IV SOLN: INTRAVENOUS | Status: DC

## 2019-10-27 MED ORDER — FENTANYL CITRATE (PF) 100 MCG/2ML IJ SOLN
INTRAMUSCULAR | Status: AC
  Filled 2019-10-27: qty 2

## 2019-10-27 MED ORDER — FENTANYL CITRATE (PF) 100 MCG/2ML IJ SOLN: 25.0000 ug | INTRAMUSCULAR | Status: DC | PRN

## 2019-10-27 MED ORDER — CELECOXIB 200 MG PO CAPS
200.0000 mg | ORAL_CAPSULE | ORAL | Status: AC
  Administered 2019-10-27: 200 mg via ORAL

## 2019-10-27 MED ORDER — BUPIVACAINE HCL (PF) 0.25 % IJ SOLN
INTRAMUSCULAR | Status: AC
  Filled 2019-10-27: qty 30

## 2019-10-27 MED ORDER — SUCCINYLCHOLINE CHLORIDE 200 MG/10ML IV SOSY
PREFILLED_SYRINGE | INTRAVENOUS | Status: AC
  Filled 2019-10-27: qty 10

## 2019-10-27 MED ORDER — GABAPENTIN 300 MG PO CAPS
ORAL_CAPSULE | ORAL | Status: AC
  Filled 2019-10-27: qty 1

## 2019-10-27 MED ORDER — BUPIVACAINE-EPINEPHRINE 0.25% -1:200000 IJ SOLN
INTRAMUSCULAR | Status: DC | PRN
  Administered 2019-10-27: 16 mL

## 2019-10-27 MED ORDER — LIDOCAINE 2% (20 MG/ML) 5 ML SYRINGE
INTRAMUSCULAR | Status: AC
  Filled 2019-10-27: qty 5

## 2019-10-27 MED ORDER — ACETAMINOPHEN 500 MG PO TABS
1000.0000 mg | ORAL_TABLET | ORAL | Status: AC
  Administered 2019-10-27: 1000 mg via ORAL

## 2019-10-27 MED ORDER — PROPOFOL 10 MG/ML IV BOLUS
INTRAVENOUS | Status: DC | PRN
  Administered 2019-10-27: 150 mg via INTRAVENOUS

## 2019-10-27 MED ORDER — IBUPROFEN 800 MG PO TABS: 800.0000 mg | ORAL_TABLET | Freq: Three times a day (TID) | ORAL | 0 refills | Status: DC | PRN

## 2019-10-27 MED ORDER — ACETAMINOPHEN 500 MG PO TABS
ORAL_TABLET | ORAL | Status: AC
  Filled 2019-10-27: qty 2

## 2019-10-27 MED ORDER — DEXAMETHASONE SODIUM PHOSPHATE 10 MG/ML IJ SOLN
INTRAMUSCULAR | Status: DC | PRN
  Administered 2019-10-27: 4 mg via INTRAVENOUS

## 2019-10-27 MED ORDER — FENTANYL CITRATE (PF) 100 MCG/2ML IJ SOLN
50.0000 ug | INTRAMUSCULAR | Status: DC | PRN
  Administered 2019-10-27 (×2): 25 ug via INTRAVENOUS

## 2019-10-27 MED ORDER — BUPIVACAINE HCL (PF) 0.5 % IJ SOLN
INTRAMUSCULAR | Status: AC
  Filled 2019-10-27: qty 30

## 2019-10-27 MED ORDER — ACETAMINOPHEN 10 MG/ML IV SOLN: 1000.0000 mg | Freq: Once | INTRAVENOUS | Status: DC | PRN

## 2019-10-27 MED ORDER — MIDAZOLAM HCL 2 MG/2ML IJ SOLN: 1.0000 mg | INTRAMUSCULAR | Status: DC | PRN

## 2019-10-27 SURGICAL SUPPLY — 37 items
BENZOIN TINCTURE PRP APPL 2/3 (GAUZE/BANDAGES/DRESSINGS) IMPLANT
BLADE SURG 10 STRL SS (BLADE) IMPLANT
BLADE SURG 15 STRL LF DISP TIS (BLADE) ×1 IMPLANT
BLADE SURG 15 STRL SS (BLADE) ×1
CANISTER SUCT 1200ML W/VALVE (MISCELLANEOUS) IMPLANT
CHLORAPREP W/TINT 26 (MISCELLANEOUS) ×2 IMPLANT
COVER BACK TABLE 60X90IN (DRAPES) ×2 IMPLANT
COVER MAYO STAND STRL (DRAPES) ×2 IMPLANT
COVER WAND RF STERILE (DRAPES) IMPLANT
DECANTER SPIKE VIAL GLASS SM (MISCELLANEOUS) IMPLANT
DERMABOND ADVANCED (GAUZE/BANDAGES/DRESSINGS)
DERMABOND ADVANCED .7 DNX12 (GAUZE/BANDAGES/DRESSINGS) IMPLANT
DRAPE LAPAROTOMY 100X72 PEDS (DRAPES) ×2 IMPLANT
DRAPE UTILITY XL STRL (DRAPES) ×2 IMPLANT
ELECT COATED BLADE 2.86 ST (ELECTRODE) ×2 IMPLANT
ELECT REM PT RETURN 9FT ADLT (ELECTROSURGICAL) ×2
ELECTRODE REM PT RTRN 9FT ADLT (ELECTROSURGICAL) ×1 IMPLANT
GLOVE BIOGEL PI IND STRL 8 (GLOVE) ×1 IMPLANT
GLOVE BIOGEL PI INDICATOR 8 (GLOVE) ×1
GLOVE ECLIPSE 8.0 STRL XLNG CF (GLOVE) ×2 IMPLANT
GOWN STRL REUS W/ TWL LRG LVL3 (GOWN DISPOSABLE) ×2 IMPLANT
GOWN STRL REUS W/TWL LRG LVL3 (GOWN DISPOSABLE) ×2
NEEDLE HYPO 25X1 1.5 SAFETY (NEEDLE) ×2 IMPLANT
NS IRRIG 1000ML POUR BTL (IV SOLUTION) ×2 IMPLANT
PACK BASIN DAY SURGERY FS (CUSTOM PROCEDURE TRAY) ×2 IMPLANT
PENCIL SMOKE EVACUATOR (MISCELLANEOUS) ×2 IMPLANT
SLEEVE SCD COMPRESS KNEE MED (MISCELLANEOUS) ×2 IMPLANT
SPONGE LAP 4X18 RFD (DISPOSABLE) IMPLANT
STAPLER VISISTAT 35W (STAPLE) IMPLANT
STRIP CLOSURE SKIN 1/2X4 (GAUZE/BANDAGES/DRESSINGS) IMPLANT
SUT MON AB 4-0 PC3 18 (SUTURE) ×2 IMPLANT
SUT VICRYL 3-0 CR8 SH (SUTURE) ×2 IMPLANT
SUT VICRYL AB 3 0 TIES (SUTURE) IMPLANT
SYR CONTROL 10ML LL (SYRINGE) ×2 IMPLANT
TOWEL GREEN STERILE FF (TOWEL DISPOSABLE) ×4 IMPLANT
TUBE CONNECTING 20X1/4 (TUBING) IMPLANT
YANKAUER SUCT BULB TIP NO VENT (SUCTIONS) IMPLANT

## 2019-10-27 NOTE — Discharge Instructions (Signed)
No Tylenol until 1:15pm, no ibuprofen until 3:15pm    Post Anesthesia Home Care Instructions  Activity: Get plenty of rest for the remainder of the day. A responsible individual must stay with you for 24 hours following the procedure.  For the next 24 hours, DO NOT: -Drive a car -Paediatric nurse -Drink alcoholic beverages -Take any medication unless instructed by your physician -Make any legal decisions or sign important papers.  Meals: Start with liquid foods such as gelatin or soup. Progress to regular foods as tolerated. Avoid greasy, spicy, heavy foods. If nausea and/or vomiting occur, drink only clear liquids until the nausea and/or vomiting subsides. Call your physician if vomiting continues.  Special Instructions/Symptoms: Your throat may feel dry or sore from the anesthesia or the breathing tube placed in your throat during surgery. If this causes discomfort, gargle with warm salt water. The discomfort should disappear within 24 hours.  If you had a scopolamine patch placed behind your ear for the management of post- operative nausea and/or vomiting:  1. The medication in the patch is effective for 72 hours, after which it should be removed.  Wrap patch in a tissue and discard in the trash. Wash hands thoroughly with soap and water. 2. You may remove the patch earlier than 72 hours if you experience unpleasant side effects which may include dry mouth, dizziness or visual disturbances. 3. Avoid touching the patch. Wash your hands with soap and water after contact with the patch.    GENERAL SURGERY: POST OP INSTRUCTIONS  ######################################################################  EAT Gradually transition to a high fiber diet with a fiber supplement over the next few weeks after discharge.  Start with a pureed / full liquid diet (see below)  WALK Walk an hour a day.  Control your pain to do that.    CONTROL PAIN Control pain so that you can walk, sleep,  tolerate sneezing/coughing, go up/down stairs.  HAVE A BOWEL MOVEMENT DAILY Keep your bowels regular to avoid problems.  OK to try a laxative to override constipation.  OK to use an antidairrheal to slow down diarrhea.  Call if not better after 2 tries  CALL IF YOU HAVE PROBLEMS/CONCERNS Call if you are still struggling despite following these instructions. Call if you have concerns not answered by these instructions  ######################################################################    1. DIET: Follow a light bland diet & liquids the first 24 hours after arrival home, such as soup, liquids, starches, etc.  Be sure to drink plenty of fluids.  Quickly advance to a usual solid diet within a few days.  Avoid fast food or heavy meals as your are more likely to get nauseated or have irregular bowels.  A low-fat, high-fiber diet for the rest of your life is ideal.   2. Take your usually prescribed home medications unless otherwise directed. 3. PAIN CONTROL: a. Pain is best controlled by a usual combination of three different methods TOGETHER: i. Ice/Heat ii. Over the counter pain medication iii. Prescription pain medication b. Most patients will experience some swelling and bruising around the incisions.  Ice packs or heating pads (30-60 minutes up to 6 times a day) will help. Use ice for the first few days to help decrease swelling and bruising, then switch to heat to help relax tight/sore spots and speed recovery.  Some people prefer to use ice alone, heat alone, alternating between ice & heat.  Experiment to what works for you.  Swelling and bruising can take several weeks to resolve.  c. It is helpful to take an over-the-counter pain medication regularly for the first few weeks.  Choose one of the following that works best for you: i. Naproxen (Aleve, etc)  Two 220mg  tabs twice a day ii. Ibuprofen (Advil, etc) Three 200mg  tabs four times a day (every meal & bedtime) iii. Acetaminophen  (Tylenol, etc) 500-650mg  four times a day (every meal & bedtime) d. A  prescription for pain medication (such as oxycodone, hydrocodone, etc) should be given to you upon discharge.  Take your pain medication as prescribed.  i. If you are having problems/concerns with the prescription medicine (does not control pain, nausea, vomiting, rash, itching, etc), please call us [phone] to see if we need to switch you to a different pain medicine that will work better for you and/or control your side effect better. ii. If you need a refill on your pain medication, please contact your pharmacy.  They will contact our office to request authorization. Prescriptions will not be filled after 5 pm or on week-ends. 4. Avoid getting constipated.  Between the surgery and the pain medications, it is common to experience some constipation.  Increasing fluid intake and taking a fiber supplement (such as Metamucil, Citrucel, FiberCon, MiraLax, etc) 1-2 times a day regularly will usually help prevent this problem from occurring.  A mild laxative (prune juice, Milk of Magnesia, MiraLax, etc) should be taken according to package directions if there are no bowel movements after 48 hours.   5. Wash / shower every day.  You may shower over the dressings as they are waterproof.  Continue to shower over incision(s) after the dressing is off. 6. Remove your waterproof bandages 5 days after surgery.  You may leave the incision open to air.  You may have skin tapes (Steri Strips) covering the incision(s).  Leave them on until one week, then remove.  You may replace a dressing/Band-Aid to cover the incision for comfort if you wish.      7. ACTIVITIES as tolerated:   a. You may resume regular (light) daily activities beginning the next day--such as daily self-care, walking, climbing stairs--gradually increasing activities as tolerated.  If you can walk 30 minutes without difficulty, it is safe to try more intense activity such as  jogging, treadmill, bicycling, low-impact aerobics, swimming, etc. b. Save the most intensive and strenuous activity for last such as sit-ups, heavy lifting, contact sports, etc  Refrain from any heavy lifting or straining until you are off narcotics for pain control.   c. DO NOT PUSH THROUGH PAIN.  Let pain be your guide: If it hurts to do something, don't do it.  Pain is your body warning you to avoid that activity for another week until the pain goes down. d. You may drive when you are no longer taking prescription pain medication, you can comfortably wear a seatbelt, and you can safely maneuver your car and apply brakes. e. Shelly Reynolds may have sexual intercourse when it is comfortable.  8. FOLLOW UP in our office a. Please call [facility] at [phone] to set up an appointment to see your surgeon in the office for a follow-up appointment approximately 2-3 weeks after your surgery. b. Make sure that you call for this appointment the day you arrive home to insure a convenient appointment time. 9. IF YOU HAVE DISABILITY OR FAMILY LEAVE FORMS, BRING THEM TO THE OFFICE FOR PROCESSING.  DO NOT GIVE THEM TO YOUR DOCTOR.   WHEN TO CALL us [phone]: 1. Poor  pain control 2. Reactions / problems with new medications (rash/itching, nausea, etc)  3. Fever over 101.5 F (38.5 C) 4. Worsening swelling or bruising 5. Continued bleeding from incision. 6. Increased pain, redness, or drainage from the incision 7. Difficulty breathing / swallowing   The clinic staff is available to answer your questions during regular business hours (8:30am-5pm).  Please don't hesitate to call and ask to speak to one of our nurses for clinical concerns.   If you have a medical emergency, go to the nearest emergency room or call 911.  A surgeon from [facility] is always on call at the [facility], [place], [place], [place], [place]  [id number] ? MAIN: [phone] ? TOLL  FREE: [phone] ?  FAX [phone] [email or web address]

## 2019-10-27 NOTE — Transfer of Care (Signed)
Immediate Anesthesia Transfer of Care Note  Patient: [place]  Procedure(s) Performed: EXCISION OF LEFT THIGH LIPOMA (Left Thigh)  Patient Location: PACU  Anesthesia Type:General  Level of Consciousness: sedated, patient cooperative and responds to stimulation  Airway & Oxygen Therapy: Patient Spontanous Breathing and Patient connected to face mask oxygen  Post-op Assessment: Report given to RN, Post -op Vital signs reviewed and stable and Patient moving all extremities  Post vital signs: Reviewed and stable  Last Vitals:  Vitals Value Taken Time  BP 124/89 10/27/19 0802  Temp    Pulse 73 10/27/19 0803  Resp 9 10/27/19 0803  SpO2 100 % 10/27/19 0803  Vitals shown include unvalidated device data.  Last Pain:  Vitals:   10/27/19 0701  TempSrc: Tympanic  PainSc: 0-No pain         Complications: No apparent anesthesia complications

## 2019-10-27 NOTE — Interval H&P Note (Signed)
History and Physical Interval Note:  10/27/2019 7:20 AM  Shelly Reynolds  has presented today for surgery, with the diagnosis of LEFT THIGH LIPOMA.  The various methods of treatment have been discussed with the patient and family. After consideration of risks, benefits and other options for treatment, the patient has consented to  Procedure(s): EXCISION OF LEFT THIGH LIPOMA (Left) as a surgical intervention.  The patient's history has been reviewed, patient examined, no change in status, stable for surgery.  I have reviewed the patient's chart and labs.  Questions were answered to the patient's satisfaction.     [place]

## 2019-10-27 NOTE — Op Note (Signed)
Preoperative diagnosis: 4 cm x 3 cm left medial thigh lipoma  SUBCUTANEOUS  Postop diagnosis: Same  Procedure: Excision of left thigh lipoma  Surgeon: Erroll Luna, MD  Anesthesia: LMA with 0.25% Marcaine plain  EBL: Minimal  Specimen: Lipoma to pathology  Indications for procedure: The patient is a 64 year old female with a painful left medial thigh lipoma.  She presents today for excision.The procedure has been discussed with the patient.  Alternative therapies have been discussed with the patient.  Operative risks include bleeding,  Infection,  Organ injury,  Nerve injury,  Blood vessel injury,  DVT,  Pulmonary embolism,  Death,  And possible reoperation.  Medical management risks include worsening of present situation.  The success of the procedure is 50 -90 % at treating patients symptoms.  The patient understands and agrees to proceed.   Description of procedure: The patient was met in the holding area.  The area was marked with the assistance of the patient and verified on her left medial thigh.  Questions were answered.  She was taken back to the room placed supine upon the OR table.  After induction of general seizure she was frog-legged in the medial left thigh was prepped and draped in sterile fashion.  Timeout was performed.  Incision was made over the preoperative mark.  Dissection was carried down and the lipoma was dissected out of the subcutaneous fat quite easily and removed.  It measured 4 x 3 cm.  Hemostasis achieved with cautery.  Local anesthetic was infiltrated to the cavity.  He was then closed with 3-0 Vicryl and 4 Monocryl.  Dermabond was applied.  All final counts were found to be correct.  The patient was awoke extubated taken recovery in satisfactory condition.

## 2019-10-27 NOTE — Anesthesia Procedure Notes (Signed)
Procedure Name: LMA Insertion Date/Time: 10/27/2019 7:31 AM Performed by: Myna Bright, CRNA Pre-anesthesia Checklist: Patient identified, Emergency Drugs available, Suction available and Patient being monitored Patient Re-evaluated:Patient Re-evaluated prior to induction Oxygen Delivery Method: Circle system utilized Preoxygenation: Pre-oxygenation with 100% oxygen Induction Type: IV induction Ventilation: Mask ventilation without difficulty LMA: LMA inserted LMA Size: 4.0 Number of attempts: 1 Placement Confirmation: positive ETCO2 and breath sounds checked- equal and bilateral Tube secured with: Tape Dental Injury: Teeth and Oropharynx as per pre-operative assessment

## 2019-10-27 NOTE — H&P (Signed)
Shelly Reynolds: [facility] Patient #: [id number] DOB: 1956-01-17 Married / Language: English / Race: White Female  History of Present Illness  Patient words: Patient presents at the request of Dr. Briscoe Deutscher for a left medial thigh mass. Didn't present for many years. 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 The mass is located on her medial upper inner thigh. It's about the size of a golf ball. It does cause rubbing of the upper thigh discomfort. There is no redness or drainage at this summer got quite macerated. There is no foul smell.  The patient is a 64 year old female.   Past Surgical History Hysterectomy (not due to cancer) - Complete  Diagnostic Studies History Colonoscopy 5-10 years ago Mammogram within last year Pap Smear 1-5 years ago  Allergies  No Known Drug Allergies  Medication History  No Current Medications Medications Reconciled  Social History  Alcohol use Occasional alcohol use. Caffeine use Tea. No drug use Tobacco use Never smoker.  Family History  Arthritis Father, Mother. Diabetes Mellitus Mother. Heart Disease Brother, Mother. Heart disease in female family member before age 55  Pregnancy / Birth History Age at menarche 28 years. Age of menopause 16-50 Gravida 3 Maternal age 56-20 Para 3  Other Problems Gastroesophageal Reflux Disease     Review of Systems  General Not Present- Appetite Loss, Chills, Fatigue, Fever, Night Sweats, Weight Gain and Weight Loss. Skin Not Present- Change in Wart/Mole, Dryness, Hives, Jaundice, New Lesions, Non-Healing Wounds, Rash and Ulcer. HEENT Not Present- Earache, Hearing Loss, Hoarseness, Nose Bleed, Oral Ulcers, Ringing in the Ears, Seasonal Allergies, Sinus Pain, Sore Throat, Visual Disturbances, Wears glasses/contact lenses and Yellow Eyes. Respiratory Not Present- Bloody  sputum, Chronic Cough, Difficulty Breathing, Snoring and Wheezing. Breast Not Present- Breast Mass, Breast Pain, Nipple Discharge and Skin Changes. Cardiovascular Not Present- Chest Pain, Difficulty Breathing Lying Down, Leg Cramps, Palpitations, Rapid Heart Rate, Shortness of Breath and Swelling of Extremities. Gastrointestinal Not Present- Abdominal Pain, Bloating, Bloody Stool, Change in Bowel Habits, Chronic diarrhea, Constipation, Difficulty Swallowing, Excessive gas, Gets full quickly at meals, Hemorrhoids, Indigestion, Nausea, Rectal Pain and Vomiting. Female Genitourinary Not Present- Frequency, Nocturia, Painful Urination, Pelvic Pain and Urgency. Musculoskeletal Not Present- Back Pain, Joint Pain, Joint Stiffness, Muscle Pain, Muscle Weakness and Swelling of Extremities. Neurological Not Present- Decreased Memory, Fainting, Headaches, Numbness, Seizures, Tingling, Tremor, Trouble walking and Weakness. Psychiatric Not Present- Anxiety, Bipolar, Change in Sleep Pattern, Depression, Fearful and Frequent crying. Endocrine Not Present- Cold Intolerance, Excessive Hunger, Hair Changes, Heat Intolerance, Hot flashes and New Diabetes. Hematology Not Present- Blood Thinners, Easy Bruising, Excessive bleeding, Gland problems, HIV and Persistent Infections.  Vitals  09/18/2019 10:59 AM Weight: 156 lb Height: 59in Body Surface Area: 1.66 m Body Mass Index: 31.51 kg/m  Temp.: 44F(Tympanic)  Pulse: 73 (Regular)  BP: 140/90 (Sitting, Right Arm, Standard)        Physical Exam  General Mental Status-Alert. General Appearance-Consistent with stated age. Hydration-Well hydrated. Voice-Normal.  Integumentary Note: Left medial thigh is an oval-shaped 5 cm mobile mass on the upper inner aspect of her thigh. This is the subcutaneous tissue. Consistent with lipoma  Head and Neck Head-normocephalic, atraumatic with no lesions or palpable  masses. Trachea-midline. Thyroid Gland Characteristics - normal size and consistency.  Eye Eyeball - Bilateral-Extraocular movements intact. Sclera/Conjunctiva - Bilateral-No scleral icterus.  Chest and Lung Exam Note: Worker breathing normal. No wheezing  Cardiovascular Note:  NSR  Neurologic Neurologic evaluation reveals -alert and oriented x 3 with no impairment of recent or remote memory. Mental Status-Normal.  Musculoskeletal Normal Exam - Left-Upper Extremity Strength Normal and Lower Extremity Strength Normal. Normal Exam - Right-Upper Extremity Strength Normal and Lower Extremity Strength Normal.    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 The patient would like to have excised due to irritation discomfort. Discussed risk of surgery to include but not exclusive of bleeding, infection, nerve injury, blood vessel injury, recurrence, cosmetic deformity, wound complications, and the need for other additional procedures. Also discussed the potential for new lipoma and recurrence of lipoma  Current Plans Pt Education - [facility] General Post-op HCI The pathophysiology of skin & subcutaneous masses was discussed. Natural history risks without surgery were discussed. I recommended surgery to remove the mass. I explained the technique of removal with use of local anesthesia & possible need for more aggressive sedation/anesthesia for patient comfort.  Risks such as bleeding, infection, wound breakdown, heart attack, death, and other risks were discussed. I noted a good likelihood this will help address the problem. Possibility that this will not correct all symptoms was explained. Possibility of regrowth/recurrence of the mass was discussed. We will work to minimize complications. Questions were answered. The patient expresses understanding & wishes to proceed with surgery.

## 2019-10-27 NOTE — Anesthesia Postprocedure Evaluation (Signed)
Anesthesia Post Note  Patient: Shelly Reynolds  Procedure(s) Performed: EXCISION OF LEFT THIGH LIPOMA (Left Thigh)     Patient location during evaluation: PACU Anesthesia Type: General Level of consciousness: awake and alert Pain management: pain level controlled Vital Signs Assessment: post-procedure vital signs reviewed and stable Respiratory status: spontaneous breathing, nonlabored ventilation, respiratory function stable and patient connected to nasal cannula oxygen Cardiovascular status: blood pressure returned to baseline and stable Postop Assessment: no apparent nausea or vomiting Anesthetic complications: no    Last Vitals:  Vitals:   10/27/19 0815 10/27/19 0830  BP: 134/84 136/84  Pulse: 71 70  Resp: 11 18  Temp:  36.5 C  SpO2: 100% 100%    Last Pain:  Vitals:   10/27/19 0830  TempSrc:   PainSc: 0-No pain                 [place]

## 2019-10-28 ENCOUNTER — Encounter: Payer: Self-pay | Admitting: *Deleted

## 2019-10-28 LAB — SURGICAL PATHOLOGY

## 2019-10-30 ENCOUNTER — Ambulatory Visit
Admission: RE | Admit: 2019-10-30 | Discharge: 2019-10-30 | Disposition: A | Discharge status: Home / Self Care | Payer: No Typology Code available for payment source | Source: Ambulatory Visit | Attending: Physician Assistant | Admitting: Physician Assistant

## 2019-10-30 ENCOUNTER — Other Ambulatory Visit: Payer: Self-pay

## 2019-10-30 DIAGNOSIS — Z1231 Encounter for screening mammogram for malignant neoplasm of breast: Secondary | ICD-10-CM

## 2019-11-11 ENCOUNTER — Encounter: Payer: No Typology Code available for payment source | Admitting: Physician Assistant

## 2020-01-25 ENCOUNTER — Other Ambulatory Visit: Payer: Self-pay

## 2020-01-25 ENCOUNTER — Encounter: Payer: Self-pay | Admitting: Physician Assistant

## 2020-01-25 ENCOUNTER — Ambulatory Visit (INDEPENDENT_AMBULATORY_CARE_PROVIDER_SITE_OTHER): Payer: No Typology Code available for payment source | Admitting: Physician Assistant

## 2020-01-25 VITALS — BP 150/90 | HR 80 | Temp 98.3°F | Ht 59.0 in | Wt 152.5 lb

## 2020-01-25 DIAGNOSIS — M546 Pain in thoracic spine: Secondary | ICD-10-CM

## 2020-01-25 DIAGNOSIS — M5441 Lumbago with sciatica, right side: Secondary | ICD-10-CM | POA: Diagnosis not present

## 2020-01-25 MED ORDER — PREDNISONE 20 MG PO TABS: 40.0000 mg | ORAL_TABLET | Freq: Every day | ORAL | 0 refills | Status: DC

## 2020-01-25 NOTE — Progress Notes (Signed)
Shelly Reynolds is a 64 y.o. female is here for transfer of care.  I acted as a Education administrator for [employer], PA-C Anselmo Pickler, LPN  History of Present Illness:   Chief Complaint  Patient presents with  . Transfer of care  . Shoulder Pain  . Back Pain    HPI   Pt is here for transfer of care from Dr. 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  Shoulder pain Pt c/o pain in right shoulder x 6 months. Pt has [place] hx of shoulder and back pain. Pt states it is a burning pain that radiates to her back. Pt is using Ibuprofen with no relief, Bio-Freeze with some relief. She finds that she responds well to 800 mg ibuprofen, however she is s/p gastric surgery.  She saw Dr. Lynne Leader on 10/19/19 for this and was recommended to do PT and consider purchasing a TENS unit. She did not do this. Pt has been doing PT at home for the past 4 months -- she taught herself these exercises because she states that she never heard back from the office about an appointment.   Back pain Pt c/o lower back pain radiating down in to right gluteal and down into leg x's 3 weeks. Pt is having numbness and tingling in her foot. Pt has not tried anything for her symptoms. She denies: bowel/bladder incontinence. Lumbar xray and MRI done in Aug 2018.  Wt Readings from Last 5 Encounters:  01/25/20 152 lb 8 oz (69.2 kg)  10/27/19 151 lb 14.4 oz (68.9 kg)  10/19/19 153 lb 9.6 oz (69.7 kg)  07/28/19 150 lb (68 kg)  01/21/19 150 lb (68 kg)   Body mass index is 30.8 kg/m.  MRI done in Aug 2018 IMPRESSION: 1. L5-S1 minimal grade 1 anterolisthesis. 2. Lumbar degenerative changes with disc and prominent lower lumbar facet arthrosis. 3. No significant canal stenosis.  No acute osseous  abnormality. 4. Mild bilateral L3-4 and L4-5 foraminal stenosis and moderate bilateral L5-S1 foraminal stenosis.  There are no preventive care reminders to display for this patient.  Past Medical History:  Diagnosis Date  . Arthritis   . Depression   . History of gastric bypass 2009  . Sleep apnea    does not use CPAP now had gastric bypass  . Urine incontinence      Social History   Socioeconomic History  . Marital status: Married    Spouse name: Not on file  . Number of children: Not on file  . Years of education: Not on file  . Highest education level: Not on file  Occupational History  . Not on file  Tobacco Use  . Smoking status: Never Smoker  . Smokeless tobacco: Never Used  Substance and Sexual Activity  . Alcohol use: Yes    Comment: OCCASSIONAL  . Drug use: No  . Sexual activity: Yes    Comment: HYSTERECTOMY  Other Topics Concern  . Not on file  Social History Narrative   Works at [employer] in the [place] Strain:   . Difficulty of Paying Living Expenses:   Food Insecurity:   . Worried About Charity fundraiser in  the Last Year:   . [place] in the Last Year:   Transportation Needs:   . Film/video editor (Medical):   Marland Kitchen Lack of Transportation (Non-Medical):   Physical Activity:   . Days of Exercise per Week:   . Minutes of Exercise per Session:   Stress:   . Feeling of Stress :   Social Connections:   . Frequency of Communication with Friends and Family:   . Frequency of Social Gatherings with Friends and Family:   . Attends Religious Services:   . Active Member of Clubs or Organizations:   . Attends Archivist Meetings:   Marland Kitchen Marital Status:   Intimate Partner Violence:   . Fear of Current or Ex-Partner:   . Emotionally Abused:   Marland Kitchen Physically Abused:   . Sexually Abused:     Past Surgical History:  Procedure Laterality Date  . ABDOMINAL HYSTERECTOMY  2011  .  BLADDER REPAIR    . BREAST REDUCTION SURGERY    . GASTRIC BYPASS    . LIPOMA EXCISION Left 10/27/2019   Procedure: EXCISION OF LEFT THIGH LIPOMA;  Surgeon: Erroll Luna, MD;  Location: [facility];  Service: General;  Laterality: Left;  . REDUCTION MAMMAPLASTY Bilateral   . TONSILLECTOMY AND ADENOIDECTOMY Bilateral 1962  . TUBAL LIGATION    . TUMMY TUCK N/A     Family History  Problem Relation Age of Onset  . Arthritis Mother   . Cancer Mother   . Heart disease Mother   . Diabetes Mother   . Breast cancer Mother        61s  . Heart disease Father   . Hypertension Father   . Alcohol abuse Brother   . Alcohol abuse Maternal Uncle   . Alcohol abuse Maternal Grandmother   . Breast cancer Maternal Aunt 35    PMHx, SurgHx, SocialHx, FamHx, Medications, and Allergies were reviewed in the Visit Navigator and updated as appropriate.   Patient Active Problem List   Diagnosis Date Noted  . Morbid obesity ([place]) 12/04/2017  . Chronic pain of left knee 05/21/2017  . S/P gastric bypass 05/21/2017  . 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 Smoking status: Never Smoker  . Smokeless tobacco: Never Used  Substance Use Topics  . Alcohol use: Yes    Comment: OCCASSIONAL  . Drug use: No    Current Medications and Allergies:    Current Outpatient Medications:  .  ibuprofen (ADVIL) 200 MG tablet, Take 800 mg by mouth every 8 (eight) hours as needed. , Disp: , Rfl:  .  phentermine (ADIPEX-P) 37.5 MG tablet, One tab in am and 1/2 tab in afternoon. (Patient not taking: Reported on 01/25/2020), Disp: 45 tablet, Rfl: 3 .  predniSONE (DELTASONE) 20 MG tablet, Take 2 tablets (40 mg total) by mouth daily., Disp: 10 tablet, Rfl: 0  No Known Allergies  Review of Systems   ROS  Negative unless otherwise specified per HPI.  Vitals:   Vitals:   01/25/20 1337  BP: (!) 150/90  Pulse: 80  Temp: 98.3 F (36.8 C)  TempSrc: Temporal  SpO2:  98%  Weight: 152 lb 8 oz (69.2 kg)  Height: 4\' 11"  (1.499 m)     Body mass index is 30.8 kg/m.   Physical Exam:    Physical Exam Vitals and nursing note reviewed.  Constitutional:      General: She is not in acute  distress.    Appearance: She is well-developed. She is not ill-appearing or toxic-appearing.  Cardiovascular:     Rate and Rhythm: Normal rate and regular rhythm.     Pulses: Normal pulses.     Heart sounds: Normal heart sounds, S1 normal and S2 normal.     Comments: No LE edema Pulmonary:     Effort: Pulmonary effort is normal.     Breath sounds: Normal breath sounds.  Musculoskeletal:     Comments: Decreased ROM 2/2 pain with flexion/extension, lateral side bends, and rotation of lower back. Reproducible tenderness with deep palpation to bilateral cervical paraspinal muscles and R cervical spinal muscles. No bony tenderness.    Skin:    General: Skin is warm and dry.  Neurological:     General: No focal deficit present.     Mental Status: She is alert.     GCS: GCS eye subscore is 4. GCS verbal subscore is 5. GCS motor subscore is 6.  Psychiatric:        Speech: Speech normal.        Behavior: Behavior normal. Behavior is cooperative.      Assessment and Plan:    Shelly Reynolds was seen today for transfer of care, shoulder pain and back pain.  Diagnoses and all orders for this visit:  Acute bilateral low back pain with right-sided sciatica  Acute bilateral thoracic back pain  Other orders -     predniSONE (DELTASONE) 20 MG tablet; Take 2 tablets (40 mg total) by mouth daily.   Appointment made with Dr. Lynne Leader to follow-up on this later this week.  Start oral prednisone. Avoid NSAIDs 2/2 gastric surgery history.  Worsening precautions advised -- low threshold to go to the ER if worsening symptoms.  . Reviewed expectations re: course of current medical issues. . Discussed self-management of symptoms. . Outlined signs and symptoms indicating need  for more acute intervention. . Patient verbalized understanding and all questions were answered. . See orders for this visit as documented in the electronic medical record. . Patient received an After Visit Summary.  CMA or LPN served as scribe during this visit. History, Physical, and Plan performed by medical provider. The above documentation has been reviewed and is accurate and complete.  Inda Coke, PA-C [place], [facility] 01/25/2020  Follow-up: No follow-ups on file.

## 2020-01-25 NOTE — Patient Instructions (Signed)
It was great to see you!  Start oral prednisone for your back/shoulder pain.  May also consider Tylenol Arthritis.  Take care,  Inda Coke PA-C

## 2020-01-28 ENCOUNTER — Encounter: Payer: Self-pay | Admitting: Family Medicine

## 2020-01-28 ENCOUNTER — Ambulatory Visit: Payer: Self-pay

## 2020-01-28 ENCOUNTER — Ambulatory Visit: Payer: No Typology Code available for payment source | Admitting: Family Medicine

## 2020-01-28 ENCOUNTER — Other Ambulatory Visit: Payer: Self-pay

## 2020-01-28 VITALS — BP 140/90 | HR 80 | Ht 59.0 in | Wt 154.0 lb

## 2020-01-28 DIAGNOSIS — M7071 Other bursitis of hip, right hip: Secondary | ICD-10-CM

## 2020-01-28 DIAGNOSIS — M542 Cervicalgia: Secondary | ICD-10-CM | POA: Diagnosis not present

## 2020-01-28 DIAGNOSIS — M62838 Other muscle spasm: Secondary | ICD-10-CM | POA: Diagnosis not present

## 2020-01-28 DIAGNOSIS — M7918 Myalgia, other site: Secondary | ICD-10-CM | POA: Diagnosis not present

## 2020-01-28 NOTE — Patient Instructions (Signed)
Thank you for coming in today.  Get xray at [facility].  Call that office to schedule.  Once cspine xray is done I will order MRI.  Really consider PT.   Call or go to the ER if you develop a large red swollen joint with extreme pain or oozing puss.

## 2020-01-28 NOTE — Progress Notes (Signed)
Shelly Reynolds, am serving as a Education administrator for Dr. Lynne Leader.  Shelly Reynolds is a 64 y.o. female who presents to [place] at [facility] today for f/u of R posterior scapular pain.  She was last seen by Dr. Georgina Snell on 10/19/19 and was referred to outpatient PT.  Since her last visit, pt reports she is feeling worse shoulder feels like a bunch of bee stings and is now having pain in her R buttock when sitting and when standing on R foot pain shoots up her leg. Patient is hoping for an MRI. She notes that her pain is located at the base of her C-spine and into right trap and rhomboid region. Pain is worse with lifting and shoulder and neck motion. No pain radiating down arm or lateral shoulder pain.   She notes that her buttock pain is located mostly at ischial tuberosity. Pain is worse with standing and walking. She does note pain radiating occasionally into lower leg. She has trouble isolating the location of the distal pain either laterally medial or posterior. She notes the distal pain is inconstant and not significant. The majority of her lower extremity pain is located in the buttock.    She also notes that she did home exercises for her shoulder/trap/rhomboid pain but did not improve. She thinks that formal PT will be very challenging for her to schedule as she works part time and she is caring for her sick husband.     Pertinent review of systems: No fever or chills  Relevant historical information: S/P gastric bypass. Morbid obesity   Exam:  BP 140/90 (BP Location: Left Arm, Patient Position: Sitting, Cuff Size: Large)   Pulse 80   Ht 4\' 11"  (1.499 m)   Wt 154 lb (69.9 kg)   LMP 01/23/2006 (Exact Date)   SpO2 97%   BMI 31.10 kg/m  General: Well Developed, well nourished, and in no acute distress.   MSK:  C-Spine: Normal appearing. Tinder around C7 region. Normal motion.  Upper extremity reflex and strength are intact.  Right shoulder: Normal appearing.  Normal motion. TTP trap, rhomboid.  Pain reproduced with shoulder abduction and scapular retraction motion and resisted strength testing.  Pulses and cap refill are intact distally BL UE.   Lspine: Normal appearing. Nontender and normal motion.  Right hip. Normal appearing. TTP ischial tuberosity. Mild TTP lateral hip at greater trochanter. Normal hip motion.  Strength: 4/5 abdudction. 4/5 external rotation. 5/5 internal rotation and adduction. Not painful with this testing.  Pain present at ischial tuberosity with resisted knee flexion.   Mild antalgic gait.     Lab and Radiology Results Xray pelvis andf c-spine ordered and will be completed at an outside location tomorrow.   : CHEST - 2 VIEW  COMPARISON:  None  FINDINGS: Normal heart size, mediastinal contours, and pulmonary vascularity.  Lungs clear.  No infiltrate, pleural effusion or pneumothorax.  No acute osseous findings.  IMPRESSION: No acute abnormalities.   Electronically Signed   By: Lavonia Dana M.D.   On: 10/19/2019 17:51 I, Lynne Leader, personally (independently) visualized and performed the interpretation of the images attached in this note.  Diagnostic Limited MSK Ultrasound of: Right hip at ischial tuberosity Ischial tuberosity no significant bony changes.  Mild hypoechoic change superficial to hamstring origin.  No significant hamstring change at origin.  Impression: Ischial bursitis.   Procedure: Real-time Ultrasound Guided Injection of Right ischial tuberosity bursa  Device: Philips Affiniti 50G Images permanently  stored and available for review in the ultrasound unit. Verbal informed consent obtained.  Discussed risks and benefits of procedure. Warned about infection bleeding damage to structures skin hypopigmentation and fat atrophy among others. Patient expresses understanding and agreement Time-out conducted.   Noted no overlying erythema, induration, or other signs of local  infection.   Skin prepped in a sterile fashion.   Local anesthesia: Topical Ethyl chloride.   With sterile technique and under real time ultrasound guidance:  40mg  kenalog and 17ml lidocaine injected easily.   Completed without difficulty   Pain immediately resolved suggesting accurate placement of the medication.   Advised to call if fevers/chills, erythema, induration, drainage, or persistent bleeding.   Images permanently stored and available for review in the ultrasound unit.  Impression: Technically successful ultrasound guided injection.      Assessment and Plan: 65 y.o. female with pain right shoulder/trap/rhombid and right posterior hip.   Pain right shoulder/trap/rhomboid. Ongoing for months at this point. CXR in January was normal. Pain at this point mostly due to muscle dysfunction. She also has a component of referred pain from Cspine. PT would be helpful but [facility] notes that it would be very hard for her to do PT. She would like to have a more firm idea of the diagnosis before PT. Plan for Xray Cspine and followed by MRI Cspine. Xray or MRI shoulder not likely to be helpful. Will order MRI cspine once xray done.   Pain right posterior hip. Shelly Reynolds had significant improvement after diagnostic injection at ischial tuberosity. Hopefully she will be longer significant benefit from the steroid component of injection.    PDMP not reviewed this encounter. Orders Placed This Encounter  Procedures  . [place] LIMITED JOINT SPACE STRUCTURES LOW RIGHT(NO LINKED CHARGES)    Order Specific Question:   Reason for Exam (SYMPTOM  OR DIAGNOSIS REQUIRED)    Answer:   eval pain ischial    Order Specific Question:   Preferred imaging location?    Answer:   [place]  . DG Cervical Spine Complete    Standing Status:   Future    Standing Expiration Date:   03/29/2021    Order Specific Question:   Reason for Exam (SYMPTOM  OR DIAGNOSIS REQUIRED)    Answer:   eval pain  base of Cspine on right    Order Specific Question:   Preferred imaging location?    Answer:   [facility]    Order Specific Question:   Radiology Contrast Protocol - do NOT remove file path    Answer:   \\[email or web address]s.[email or web address]  . DG Pelvis 1-2 Views    Standing Status:   Future    Standing Expiration Date:   03/29/2021    Order Specific Question:   Reason for Exam (SYMPTOM  OR DIAGNOSIS REQUIRED)    Answer:   eval pain right buttock    Order Specific Question:   Preferred imaging location?    Answer:   [facility]    Order Specific Question:   Radiology Contrast Protocol - do NOT remove file path    Answer:   \\[email or web address]   No orders of the defined types were placed in this encounter.    Discussed warning signs or symptoms. Please see discharge instructions. Patient expresses understanding.   The above documentation has been reviewed and is accurate and complete Lynne Leader

## 2020-01-29 DIAGNOSIS — M7071 Other bursitis of hip, right hip: Secondary | ICD-10-CM | POA: Insufficient documentation

## 2020-02-02 ENCOUNTER — Other Ambulatory Visit: Payer: Self-pay

## 2020-02-02 ENCOUNTER — Other Ambulatory Visit: Payer: No Typology Code available for payment source

## 2020-02-02 ENCOUNTER — Ambulatory Visit (INDEPENDENT_AMBULATORY_CARE_PROVIDER_SITE_OTHER): Payer: No Typology Code available for payment source

## 2020-02-02 DIAGNOSIS — M7918 Myalgia, other site: Secondary | ICD-10-CM | POA: Diagnosis not present

## 2020-02-02 DIAGNOSIS — M542 Cervicalgia: Secondary | ICD-10-CM

## 2020-02-03 ENCOUNTER — Telehealth: Payer: Self-pay | Admitting: Family Medicine

## 2020-02-03 DIAGNOSIS — M542 Cervicalgia: Secondary | ICD-10-CM

## 2020-02-03 NOTE — Telephone Encounter (Signed)
-----   Message from Shelly Reynolds, LAT sent at 02/03/2020 11:50 AM EDT ----- Called pt and relayed her c-spine and pelvis XR results.  Pt verbalizes understanding and then states that her biggest concern is pain in her R shoulder that she describes as "bee stings." After reading through her note from 01/28/20 it appears that the concern is radicular pain from the next as a cause of her R shoulder pain.  Pt wants to know if a c-spine MRI is the next step and if one will be ordered. She is ok w/ either [facility] or [facility] for possible MRI.  Please advise.

## 2020-02-03 NOTE — Telephone Encounter (Signed)
After discussion with patient MRI C-spine ordered.

## 2020-02-03 NOTE — Progress Notes (Signed)
X-ray cervical spine shows some arthritis

## 2020-02-03 NOTE — Progress Notes (Signed)
X-ray pelvis shows no fracture.

## 2020-03-03 ENCOUNTER — Telehealth: Payer: Self-pay | Admitting: Physician Assistant

## 2020-03-03 ENCOUNTER — Other Ambulatory Visit: Payer: Self-pay

## 2020-03-03 ENCOUNTER — Encounter: Payer: Self-pay | Admitting: Family Medicine

## 2020-03-03 ENCOUNTER — Ambulatory Visit: Payer: No Typology Code available for payment source | Admitting: Family Medicine

## 2020-03-03 VITALS — BP 120/88 | HR 78 | Temp 98.5°F | Ht 59.0 in | Wt 150.2 lb

## 2020-03-03 DIAGNOSIS — M7989 Other specified soft tissue disorders: Secondary | ICD-10-CM

## 2020-03-03 DIAGNOSIS — I83812 Varicose veins of left lower extremities with pain: Secondary | ICD-10-CM

## 2020-03-03 NOTE — Telephone Encounter (Signed)
Chief Complaint Leg Swelling And Edema Reason for Call Symptomatic / Request for Health Information Initial Comment Caller stated she has a fluid patch below her left knee the size of a baseball. Translation No Nurse Assessment Nurse: Toy Cookey, RN, Stanton Kidney Date/Time Eilene Ghazi Time): 03/02/2020 5:05:43 PM Confirm and document reason for call. If symptomatic, describe symptoms. ---Caller states she has a lump below her knee to the right on top of leg. She says it is a big baseball. She states its been there two weeks. No Pain. Has the patient had close contact with a person known or suspected to have the novel coronavirus illness OR traveled / lives in area with major community spread (including international travel) in the last 14 days from the onset of symptoms? * If Asymptomatic, screen for exposure and travel within the last 14 days. ---No Does the patient have any new or worsening symptoms? ---Yes Will a triage be completed? ---Yes Related visit to physician within the last 2 weeks? ---No Does the PT have any chronic conditions? (i.e. diabetes, asthma, this includes High risk factors for pregnancy, etc.) ---No Is this a behavioral health or substance abuse call? ---No Guidelines Guideline Title Affirmed Question Affirmed Notes Nurse Date/Time Eilene Ghazi Time) Skin Lump or Localized Swelling [1] Small swelling or lump AND [2] unexplained AND [3] present > 1 week Bonnetta Barry 03/02/2020 5:08:43 PM Disp. Time Eilene Ghazi Time) Disposition Final User 03/02/2020 5:14:38 PM SEE PCP WITHIN 3 DAYS Yes Toy Cookey, RN, MaryPLEASE NOTE: All timestamps contained within this report are represented as [place] Standard Time. CONFIDENTIALTY NOTICE: This fax transmission is intended only for the addressee. It contains information that is legally privileged, confidential or otherwise protected from use or disclosure. If you are not the intended recipient, you are strictly prohibited from reviewing,  disclosing, copying using or disseminating any of this information or taking any action in reliance on or regarding this information. If you have received this fax in error, please notify us immediately by telephone so that we can arrange for its return to [place]. Phone: [phone], Toll-Free: [phone], Fax: [phone] Page: 2 of 2 Call Id: [id number] [place] Disagree/Comply Comply Caller Understands Yes PreDisposition Call Doctor Care Advice Given Per Guideline SEE PCP WITHIN 3 DAYS: * You need to be seen within 2 or 3 days. Call your doctor (or NP/PA) during regular office hours and make an appointment. A clinic or urgent care center are good places to go for care if your doctor's office is closed or you can't get an appointment. NOTE: If office will be open tomorrow, tell caller to call then, not in 3 days. CALL BACK IF: * Fever occurs * Swelling becomes painful * You become worse. CARE ADVICE given per Skin Swelling or Lump (Adult) guideline. Comments User: Dulcy Fanny, RN Date/Time Eilene Ghazi Time): 03/02/2020 5:14:36 PM Caller states she has already gotten an appointment for tomorrow 5/20 at 1300. She wants to keep that appointment. Referrals REFERRED TO PCP OFFICE

## 2020-03-03 NOTE — Patient Instructions (Signed)
It was very nice to see you today!  I think your symptoms are coming from your varicose veins.  Please use compression sleeves or stockings.  I will also place a referral for you to see the vascular doctor.  Take care, Dr Jerline Pain  Please try these tips to maintain a healthy lifestyle:   Eat at least 3 REAL meals and 1-2 snacks per day.  Aim for no more than 5 hours between eating.  If you eat breakfast, please do so within one hour of getting up.    Each meal should contain half fruits/vegetables, one quarter protein, and one quarter carbs (no bigger than a computer mouse)   Cut down on sweet beverages. This includes juice, soda, and sweet tea.     Drink at least 1 glass of water with each meal and aim for at least 8 glasses per day   Exercise at least 150 minutes every week.

## 2020-03-03 NOTE — Telephone Encounter (Signed)
Pt is scheduled for today  °

## 2020-03-03 NOTE — Progress Notes (Signed)
   Shelly Reynolds is a 64 y.o. female who presents today for an office visit.  Assessment/Plan:  New/Acute Problems: Left swelling Likely secondary to venous reflux and varicosities.  No appreciable effusion on her knee exam today-doubt swelling is coming from her knee.  Recommended she use compression stockings.  Given painful nature of her varicosities will place referral to vascular surgery for further evaluation and management.  She can use over-the-counter meds if needed.    Subjective:  HPI:  Patient has noticed swelling to the medial aspect of her left lower leg for the last few weeks.  No pain.  No stiffness.  She is concerned about fluid buildup in the knee.  No obvious injuries or precipitating events.  She has a history of varicose veins which seems to be getting more painful.  No specific treatments tried.       Objective:  Physical Exam: BP 120/88 (BP Location: Left Arm, Patient Position: Sitting, Cuff Size: Normal)   Pulse 78   Temp 98.5 F (36.9 C) (Temporal)   Ht 4\' 11"  (1.499 m)   Wt 150 lb 3.2 oz (68.1 kg)   LMP 01/23/2006 (Exact Date)   SpO2 98%   BMI 30.34 kg/m   Gen: No acute distress, resting comfortably MSK: Bilateral lower extremities with several varicosities.  Subtle soft tissue swelling noted to medial aspect of left tibia.  Surrounding varicosities painful to palpation.  Left knee with mild crepitus.  No joint effusion appreciated.  No joint line tenderness.      Algis Greenhouse. Jerline Pain, MD 03/03/2020 1:44 PM

## 2020-03-07 ENCOUNTER — Ambulatory Visit
Admission: RE | Admit: 2020-03-07 | Discharge: 2020-03-07 | Disposition: A | Discharge status: Home / Self Care | Payer: No Typology Code available for payment source | Source: Ambulatory Visit | Attending: Family Medicine | Admitting: Family Medicine

## 2020-03-07 DIAGNOSIS — M542 Cervicalgia: Secondary | ICD-10-CM

## 2020-03-08 ENCOUNTER — Telehealth: Payer: Self-pay | Admitting: Family Medicine

## 2020-03-08 NOTE — Telephone Encounter (Signed)
Pt would like to discuss MRI results, she has seen them in MyChart but has questions. Follow up scheduled for next week. Please call her cell [phone]

## 2020-03-08 NOTE — Telephone Encounter (Signed)
Returned pt's call and answered her questions regarding what is stenosis.  She has a f/u visit scheduled for June 1st.

## 2020-03-08 NOTE — Progress Notes (Signed)
MRI cervical spine shows mild multilevel arthritis of the facet joints and some mild spinal stenosis.  Please return for recheck in person to discuss MRI results and detail and next steps.

## 2020-03-15 ENCOUNTER — Ambulatory Visit: Payer: No Typology Code available for payment source | Admitting: Family Medicine

## 2020-03-15 ENCOUNTER — Other Ambulatory Visit: Payer: Self-pay

## 2020-03-15 ENCOUNTER — Encounter: Payer: Self-pay | Admitting: Family Medicine

## 2020-03-15 VITALS — BP 146/94 | HR 83 | Ht 59.0 in | Wt 151.6 lb

## 2020-03-15 DIAGNOSIS — M542 Cervicalgia: Secondary | ICD-10-CM | POA: Diagnosis not present

## 2020-03-15 DIAGNOSIS — M7918 Myalgia, other site: Secondary | ICD-10-CM

## 2020-03-15 DIAGNOSIS — M62838 Other muscle spasm: Secondary | ICD-10-CM

## 2020-03-15 NOTE — Patient Instructions (Signed)
Thank you for coming in today. I think the shoulder pain is coming from muscle spasm and fatigue AND a bit of a pinched nerve in the neck.  Next steps if needed are either formal PT or an epidural steroid injection in the neck.  However you have improved therefore ok to keep this on the back burner.  I could order either in the future if needed.  Let me know if you want me to order PT or injection.

## 2020-03-15 NOTE — Progress Notes (Signed)
Shelly Reynolds is a 64 y.o. female who presents to [facility] today for f/u of neck and R upper trap/rhomboid pain and c-spine MRI review.  She was last seen by Dr. Georgina Snell on 01/28/20 and was referred for a C-spine MRI.  She had been previously referred to PT but did not attend.  Since her last visit, pt reports that her R shoulder and scapula aren't feeling as bad.  She has been doing her HEP but has no attended PT.  Diagnostic imaging: c-spine XR- 02/02/20; c-spine MRI- 03/07/20  Pertinent review of systems: No fevers or chills  Relevant historical information: Status post gastric bypass   Exam:  BP (!) 146/94 (BP Location: Right Arm, Patient Position: Sitting, Cuff Size: Normal)   Pulse 83   Ht 4\' 11"  (1.499 m)   Wt 151 lb 9.6 oz (68.8 kg)   LMP 01/23/2006 (Exact Date)   SpO2 96%   BMI 30.62 kg/m  General: Well Developed, well nourished, and in no acute distress.   MSK: C-spine normal-appearing normal motion mildly tender palpation right trapezius.    Lab and Radiology Results EXAM: MRI CERVICAL SPINE WITHOUT CONTRAST  TECHNIQUE: Multiplanar, multisequence MR imaging of the cervical spine was performed. No intravenous contrast was administered.  COMPARISON:  Plain films February 02, 2020  FINDINGS: Alignment: Physiologic.  Vertebrae: No fracture, evidence of discitis, or bone lesion. Facet fusion is noted on the left at C3-4.  Cord: Normal signal and morphology.  Posterior Fossa, vertebral arteries, paraspinal tissues: Negative.  Disc levels:  C2-3: Facet degenerative changes resulting in mild narrowing of the left neural foramen.  C3-4: Tiny posterior disc protrusion without significant spinal canal stenosis. Facet degenerative changes resulting in mild left neural foraminal narrowing.  C4-5: Tiny posterior disc protrusion without significant spinal canal stenosis. Facet degenerative changes, left greater than  right, without significant neural foraminal narrowing.  C5-6: Posterior disc protrusion causing small indentation on the thecal sac without significant spinal canal stenosis. Uncovertebral and facet degenerative changes resulting in mild to moderate right neural foraminal narrowing.  C6-7: Small posterior disc protrusion resulting in mild spinal canal stenosis. Uncovertebral and facet degenerative changes resulting in mild-to-moderate bilateral neural foraminal narrowing, left greater than right.  C7-T1: No spinal canal or neural foraminal stenosis.  IMPRESSION: 1. Mild multilevel degenerative changes, more pronounced at the level of the facet joints, as described above. 2. Mild spinal canal stenosis at C6-7. 3. Mild to moderate neural foraminal narrowing on the right at C5-6 and bilaterally at C6-7.   Electronically Signed   By: Pedro Earls M.D.   On: 03/07/2020 16:12 I, Lynne Leader, personally (independently) visualized and performed the interpretation of the images attached in this note.     Assessment and Plan: 64 y.o. female with right trapezius/rhomboid shoulder pain.  Very likely due to muscle spasm and dysfunction as well as cervical radiculopathy at C5 or C6.  She does have neuroforaminal impingement at this level.  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  Fortunately she has had a bit of symptom improvement with home exercise on her own.  After discussion of her MRI treatment plan and options she would like to continue home exercises if needed in the future she will let me know if she would like to proceed with formal physical therapy or epidural steroid injection.   PDMP not reviewed this encounter. No orders of the defined types were  placed in this encounter.  No orders of the defined types were placed in this encounter.    Discussed warning signs or symptoms. Please see discharge instructions. Patient expresses  understanding.   The above documentation has been reviewed and is accurate and complete Lynne Leader, M.D.  Total encounter time 20 minutes including charting time date of service. MRI results, options and next steps.

## 2020-03-29 ENCOUNTER — Other Ambulatory Visit: Payer: Self-pay | Admitting: *Deleted

## 2020-03-29 DIAGNOSIS — I83893 Varicose veins of bilateral lower extremities with other complications: Secondary | ICD-10-CM

## 2020-03-31 ENCOUNTER — Other Ambulatory Visit: Payer: Self-pay

## 2020-03-31 ENCOUNTER — Telehealth: Payer: Self-pay | Admitting: Physician Assistant

## 2020-03-31 NOTE — Telephone Encounter (Signed)
  LAST APPOINTMENT DATE: 03/03/2020   NEXT APPOINTMENT DATE:@Visit  date not found  MEDICATION: [facility] [address] [place] [address]  COMMENTS: Patient states that she would rather not schedule an appointment but to see if Aldona Bar will give her this without scheduling another appt.

## 2020-03-31 NOTE — Telephone Encounter (Signed)
Please call pt and schedule an appt, can not just order medication needs to come in to discuss.

## 2020-03-31 NOTE — Telephone Encounter (Signed)
Patient is scheduled for tomorrow at 9.

## 2020-04-01 ENCOUNTER — Ambulatory Visit: Payer: No Typology Code available for payment source | Admitting: Physician Assistant

## 2020-04-01 ENCOUNTER — Encounter: Payer: Self-pay | Admitting: Physician Assistant

## 2020-04-01 VITALS — BP 140/86 | HR 76 | Temp 98.2°F | Ht 59.0 in | Wt 154.0 lb

## 2020-04-01 DIAGNOSIS — R739 Hyperglycemia, unspecified: Secondary | ICD-10-CM

## 2020-04-01 DIAGNOSIS — E669 Obesity, unspecified: Secondary | ICD-10-CM | POA: Diagnosis not present

## 2020-04-01 NOTE — Progress Notes (Signed)
Shelly Reynolds is a 64 y.o. female is here to discuss: Medication  I acted as a Education administrator for [employer], PA-C Anselmo Pickler, LPN  History of Present Illness:   Chief Complaint  Patient presents with   Discuss medication    Ozempic    HPI   Pt here to discuss starting Ozempic for weight loss.  She was on Saxenda on the past and had modest improvement.  She said that she heard that Ozempic has been approved for weight loss and would like to trial this. She would like medication to help with cravings, hunger, etc.  She is trying to be active as possible, but has issues with hip and neck pain.    Health Maintenance Due  Topic Date Due   COVID-19 Vaccine (1) Never done    Past Medical History:  Diagnosis Date   Arthritis    Depression    History of gastric bypass 2009   Sleep apnea    does not use CPAP now had gastric bypass   Urine incontinence      Social History   Tobacco Use   Smoking status: Never Smoker   Smokeless tobacco: Never Used  Substance Use Topics   Alcohol use: Yes    Comment: OCCASSIONAL   Drug use: No    Past Surgical History:  Procedure Laterality Date   ABDOMINAL HYSTERECTOMY  2011   BLADDER REPAIR     BREAST REDUCTION SURGERY     GASTRIC BYPASS     LIPOMA EXCISION Left 10/27/2019   Procedure: EXCISION OF LEFT THIGH LIPOMA;  Surgeon: Erroll Luna, MD;  Location: [facility];  Service: General;  Laterality: Left;   REDUCTION MAMMAPLASTY Bilateral    TONSILLECTOMY AND ADENOIDECTOMY Bilateral 1962   TUBAL LIGATION     TUMMY TUCK N/A     Family History  Problem Relation Age of Onset   Arthritis Mother    Cancer Mother    Heart disease Mother    Diabetes Mother    Breast cancer Mother        83s   Heart disease Father    Hypertension Father    Alcohol abuse Brother    Alcohol abuse Maternal Uncle    Alcohol abuse Maternal Grandmother    Breast cancer Maternal Aunt 35     PMHx, SurgHx, SocialHx, FamHx, Medications, and Allergies were reviewed in the Visit Navigator and updated as appropriate.   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  No Known Allergies  Review of Systems   ROS  Negative unless otherwise specified per HPI.   Vitals:   Vitals:   04/01/20 0910  BP: 140/86  Pulse: 76  Temp: 98.2 F (36.8 C)  TempSrc: Temporal  SpO2: 99%  Weight: 154 lb (69.9 kg)  Height: 4\' 11"  (1.499 m)     Body mass index is 31.1 kg/m.   Physical Exam:    Physical Exam Constitutional:  Appearance: She is well-developed.  HENT:     Head: Normocephalic and atraumatic.  Eyes:     Conjunctiva/sclera: Conjunctivae normal.  Pulmonary:     Effort: Pulmonary effort is normal.  Musculoskeletal:        General: Normal range of motion.     Cervical back: Normal range of motion and neck supple.  Skin:    General: Skin is warm and dry.  Neurological:     Mental Status: She is alert and oriented to person, place, and time.  Psychiatric:        Behavior: Behavior normal.        Thought Content: Thought content normal.        Judgment: Judgment normal.      Assessment and Plan:    Shelly Reynolds was seen today for discuss medication.  Diagnoses and all orders for this visit:  Hyperglycemia; Obesity, unspecified classification, unspecified obesity type, unspecified whether serious comorbidity present Will trial Ozempic refill. Unfortunately Mancel Parsons is not available for me  to prescribe. Given that she has had hyperglycemia in the past and has tolerated Saxenda, will trial Ozempic. Asked her to send me message in two weeks if she would like for me to prescribe this. Patient agreeable to plan.    Reviewed expectations re: course of current medical issues.  Discussed self-management of symptoms.  Outlined signs and symptoms indicating need for more acute intervention.  Patient verbalized understanding and all questions were answered.  See orders for this visit as documented in the electronic medical record.  Patient received an After Visit Summary.  CMA or LPN served as scribe during this visit. History, Physical, and Plan performed by medical provider. The above documentation has been reviewed and is accurate and complete.  Inda Coke, PA-C [place] 04/01/2020  Follow-up: No follow-ups on file.

## 2020-04-01 NOTE — Patient Instructions (Signed)
It was great to see you!  Ozempic: Start 0.25 mg once weekly for 4 weeks, then you may increase to 0.5 mg once weekly Please contact me in 2 weeks if you want me to prescribe this for you.  Take care,  Inda Coke PA-C

## 2020-04-05 ENCOUNTER — Telehealth (HOSPITAL_COMMUNITY): Payer: Self-pay

## 2020-04-05 NOTE — Telephone Encounter (Signed)

## 2020-04-06 ENCOUNTER — Ambulatory Visit (HOSPITAL_COMMUNITY)
Admission: RE | Admit: 2020-04-06 | Discharge: 2020-04-06 | Disposition: A | Discharge status: Home / Self Care | Payer: BLUE CROSS/BLUE SHIELD | Source: Ambulatory Visit | Attending: Vascular Surgery | Admitting: Vascular Surgery

## 2020-04-06 ENCOUNTER — Ambulatory Visit: Payer: No Typology Code available for payment source | Admitting: Physician Assistant

## 2020-04-06 ENCOUNTER — Other Ambulatory Visit: Payer: Self-pay

## 2020-04-06 VITALS — BP 150/89 | HR 69 | Temp 97.4°F | Resp 20 | Ht 59.0 in | Wt 151.6 lb

## 2020-04-06 DIAGNOSIS — M7989 Other specified soft tissue disorders: Secondary | ICD-10-CM

## 2020-04-06 DIAGNOSIS — I83893 Varicose veins of bilateral lower extremities with other complications: Secondary | ICD-10-CM | POA: Insufficient documentation

## 2020-04-06 NOTE — Progress Notes (Signed)
VASCULAR & VEIN SPECIALISTS           OF [place]  History and Physical   Shelly Reynolds is a 64 y.o. female who presents with complaints of swelling below her left knee medially.   This is not painful.  She states that she noticed this when she was drying off after the shower.   She does have a hx of varicose veins.  These have never bled.  She has had some mild swelling.  She has a hx of lipoma excision from the inner thigh on the left leg earlier this year.    She has hx of gastric bypass surgery and lost 70lbs with this.    She has hx of chronic left knee pain and left hip pain.  She has hx of back issues in the past.   The patient has no history of DVT. Pt does have history of varicose vein.   Pt does not have history of skin changes in lower legs.    She moved here from up [place] to be closer to her kids and grand kids 3 years ago.    The pt is not on a statin for cholesterol management.  The pt is not on a daily aspirin.   Other AC:  none The pt is not on meds for hypertension.   The pt is not diabetic.   Tobacco hx:  never  There is no family hx of AAA.  Past Medical History:  Diagnosis Date   Arthritis    Depression    History of gastric bypass 2009   Sleep apnea    does not use CPAP now had gastric bypass   Urine incontinence     Past Surgical History:  Procedure Laterality Date   ABDOMINAL HYSTERECTOMY  2011   BLADDER REPAIR     BREAST REDUCTION SURGERY     GASTRIC BYPASS     LIPOMA EXCISION Left 10/27/2019   Procedure: EXCISION OF LEFT THIGH LIPOMA;  Surgeon: Erroll Luna, MD;  Location: [facility];  Service: General;  Laterality: Left;   REDUCTION MAMMAPLASTY Bilateral    TONSILLECTOMY AND ADENOIDECTOMY Bilateral 1962   TUBAL LIGATION     TUMMY TUCK N/A     Social History   Socioeconomic History   Marital status: Married    Spouse name: Not on file   Number of children: Not on file   Years  of education: Not on file   Highest education level: Not on file  Occupational History   Not on file  Tobacco Use   Smoking status: Never Smoker   Smokeless tobacco: Never Used  Substance and Sexual Activity   Alcohol use: Yes    Comment: OCCASSIONAL   Drug use: No   Sexual activity: Yes    Comment: HYSTERECTOMY  Other Topics Concern   Not on file  Social History Narrative   Works at [employer] in the [place] Strain:    Difficulty of Paying Living Expenses:   Food Insecurity:    Worried About Charity fundraiser in the Last Year:    Arboriculturist in the Last Year:   Transportation Needs:    Film/video editor (Medical):    Lack of Transportation (Non-Medical):   Physical Activity:    Days of Exercise per Week:    Minutes of Exercise per Session:  Stress:    Feeling of Stress :   Social Connections:    Frequency of Communication with Friends and Family:    Frequency of Social Gatherings with Friends and Family:    Attends Religious Services:    Active Member of Clubs or Organizations:    Attends Music therapist:    Marital Status:   Intimate Partner Violence:    Fear of Current or Ex-Partner:    Emotionally Abused:    Physically Abused:    Sexually Abused:     Family History  Problem Relation Age of Onset   Arthritis Mother    Cancer Mother    Heart disease Mother    Diabetes Mother    Breast cancer Mother        65s   Heart disease Father    Hypertension Father    Alcohol abuse Brother    Alcohol abuse Maternal Uncle    Alcohol abuse Maternal Grandmother    Breast cancer Maternal Aunt 35    No current outpatient medications on file.   No current facility-administered medications for this visit.    No Known Allergies  REVIEW OF SYSTEMS:   [X]  denotes positive finding, [ ]  denotes negative finding Cardiac  Comments:  Chest pain  or chest pressure:    Shortness of breath upon exertion:    Short of breath when lying flat:    Irregular heart rhythm:        Vascular    Pain in calf, thigh, or hip brought on by ambulation: x   Pain in feet at night that wakes you up from your sleep:  x   Blood clot in your veins:    Leg swelling:  x       Pulmonary    Oxygen at home:    Productive cough:     Wheezing:         Neurologic    Sudden weakness in arms or legs:     Sudden numbness in arms or legs:     Sudden onset of difficulty speaking or slurred speech:    Temporary loss of vision in one eye:     Problems with dizziness:         Gastrointestinal    Blood in stool:     Vomited blood:         Genitourinary    Burning when urinating:     Blood in urine:        Psychiatric    Major depression:         Hematologic    Bleeding problems:    Problems with blood clotting too easily:        Skin    Rashes or ulcers:        Constitutional    Fever or chills:      PHYSICAL EXAMINATION:  Today's Vitals   04/06/20 1537  BP: (!) 150/89  Pulse: 69  Resp: 20  Temp: (!) 97.4 F (36.3 C)  TempSrc: Temporal  SpO2: 100%  Weight: 151 lb 9.6 oz (68.8 kg)  Height: 4\' 11"  (1.499 m)   Body mass index is 30.62 kg/m.   General:  WDWN in NAD; vital signs documented above Gait: Normal HENT: WNL, normocephalic Pulmonary: normal non-labored breathing without wheezing Cardiac: regular HR; without carotid bruits Abdomen: soft, NT, no masses Skin:  Chigger bites right lower leg present Vascular Exam/Pulses:  Right Left  Radial 2+ (normal) 2+ (normal)  DP 2+ (normal) 2+ (  normal)  PT 2+ (normal) 2+ (normal)   Extremities: without ischemic changes, without cellulitis; without open wounds; without skin color changes; she does have some superficial reticular veins present on both legs.  She has what appears to be a large lipoma medially just below the left knee and a smaller one in the same location on the right.    Musculoskeletal: no muscle wasting or atrophy  Neurologic: A&O X 3;  moving all extremities equally Psychiatric:  The pt has Normal affect.   Non-Invasive Vascular Imaging:   Venous duplex on 04/06/2020: LEFT      Reflux No Reflux Reflux Time Diameter cms Comments                         Yes                             +--------------+---------+------+-----------+------------+----------------+    CFV              yes   >1 second                      +--------------+---------+------+-----------+------------+----------------+    FV mid     no                                    +--------------+---------+------+-----------+------------+----------------+    Popliteal    no                                    +--------------+---------+------+-----------+------------+----------------+    GSV at [facility]   no                  0.4                 +--------------+---------+------+-----------+------------+----------------+    GSV prox thigh no                  0.25                +--------------+---------+------+-----------+------------+----------------+    GSV mid thigh         yes   >500 ms    0.31                +--------------+---------+------+-----------+------------+----------------+    GSV dist thigh no                  0.32                +--------------+---------+------+-----------+------------+----------------+    GSV at knee                           not visualized      +--------------+---------+------+-----------+------------+----------------+    GSV prox calf                          not visualized      +--------------+---------+------+-----------+------------+----------------+    SSV Pop Fossa                           chronic  thrombus   +--------------+---------+------+-----------+------------+----------------+    SSV prox calf  chronic  thrombus   +--------------+---------+------+-----------+------------+----------------+    SSV mid calf                          chronic  thrombus   +--------------+---------+------+-----------+------------+----------------+   Summary:  Left:  - No evidence of deep vein thrombosis seen in the left lower extremity,  from the common femoral through the popliteal veins.  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  - Superficial vein reflux in the mid great saphenous vein.   Shelly Reynolds is a 64 y.o. female who presents with swelling below the left knee medially   There is reflux in the deep CFV and in the GSV in the mid thigh, however the vein is not of adequate size for laser ablation.  She does not have much swelling in her legs that is bothersome.   She does have what appears to be a lipoma on the medial aspect of both knees with the left>right.  This is soft and there is no firmness to this.  She also had a lipoma removed from her left inner thigh earlier this year.  -she does not have any evidence of DVT.  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  I discussed this with Dr. Scot Dock and she does not need anticoagulation for this.  -discussed with pt about wearing compression socks and elevating legs as well as water aerobics. -she did have some superficial reticular veins-she states these have never bled, but I did discuss with her what to do if these ever did bleed with raising the leg and holding one finger pressure for 20-30 minutes.  I also discussed sclerotherapy as an option. -pt has hx of gastric bypass surgery in the past with 70lb weight loss   -pt will f/u as needed  Leontine Locket, [facility] 04/06/2020 2:19 PM  Clinic MD:  Scot Dock

## 2020-04-18 ENCOUNTER — Encounter: Payer: Self-pay | Admitting: Physician Assistant

## 2020-04-19 MED ORDER — OZEMPIC (0.25 OR 0.5 MG/DOSE) 2 MG/1.5ML ~~LOC~~ SOPN: 0.5000 mg | PEN_INJECTOR | SUBCUTANEOUS | 1 refills | Status: DC

## 2020-04-19 NOTE — Telephone Encounter (Signed)
Please call patient and see if she is tolerating 0.25 mg and would be agreeable to increase of 0.5 mg.   If she is agreeable to increased dose, let's send in 0.5 mg.

## 2020-04-19 NOTE — Telephone Encounter (Signed)
Pt called back asked her if she is tolerating Ozempic 0.25 mg weekly if so would you be agreeable to increase to 0.5 mg weekly. Pt verbalized under standing and said she just increased to 0.5 mg weekly this week. Told her okay will send new Rx to the pharmacy for you. Pt verbalized understanding.

## 2020-04-19 NOTE — Telephone Encounter (Signed)
Left message on voicemail to call office.  

## 2020-04-19 NOTE — Telephone Encounter (Signed)
Aldona Bar, do you want pt to start 0.5 mg once a week?

## 2020-05-18 ENCOUNTER — Encounter: Payer: Self-pay | Admitting: Physician Assistant

## 2020-08-06 ENCOUNTER — Other Ambulatory Visit: Payer: Self-pay | Admitting: Physician Assistant

## 2020-08-15 DIAGNOSIS — U071 COVID-19: Secondary | ICD-10-CM

## 2020-08-15 HISTORY — DX: COVID-19: U07.1

## 2020-08-23 ENCOUNTER — Other Ambulatory Visit: Payer: Self-pay | Admitting: Nurse Practitioner

## 2020-08-23 ENCOUNTER — Encounter: Payer: Self-pay | Admitting: Nurse Practitioner

## 2020-08-23 ENCOUNTER — Ambulatory Visit (HOSPITAL_COMMUNITY)
Admission: RE | Admit: 2020-08-23 | Discharge: 2020-08-23 | Disposition: A | Discharge status: Home / Self Care | Payer: BLUE CROSS/BLUE SHIELD | Source: Ambulatory Visit | Attending: Pulmonary Disease | Admitting: Pulmonary Disease

## 2020-08-23 DIAGNOSIS — E663 Overweight: Secondary | ICD-10-CM

## 2020-08-23 DIAGNOSIS — U071 COVID-19: Secondary | ICD-10-CM

## 2020-08-23 MED ORDER — SODIUM CHLORIDE 0.9 % IV SOLN: INTRAVENOUS | Status: DC | PRN

## 2020-08-23 MED ORDER — DIPHENHYDRAMINE HCL 50 MG/ML IJ SOLN: 50.0000 mg | Freq: Once | INTRAMUSCULAR | Status: DC | PRN

## 2020-08-23 MED ORDER — SOTROVIMAB 500 MG/8ML IV SOLN
500.0000 mg | Freq: Once | INTRAVENOUS | Status: AC
  Administered 2020-08-23: 500 mg via INTRAVENOUS

## 2020-08-23 MED ORDER — ALBUTEROL SULFATE HFA 108 (90 BASE) MCG/ACT IN AERS: 2.0000 | INHALATION_SPRAY | Freq: Once | RESPIRATORY_TRACT | Status: DC | PRN

## 2020-08-23 MED ORDER — METHYLPREDNISOLONE SODIUM SUCC 125 MG IJ SOLR: 125.0000 mg | Freq: Once | INTRAMUSCULAR | Status: DC | PRN

## 2020-08-23 MED ORDER — FAMOTIDINE IN NACL 20-0.9 MG/50ML-% IV SOLN: 20.0000 mg | Freq: Once | INTRAVENOUS | Status: DC | PRN

## 2020-08-23 MED ORDER — EPINEPHRINE 0.3 MG/0.3ML IJ SOAJ: 0.3000 mg | Freq: Once | INTRAMUSCULAR | Status: DC | PRN

## 2020-08-23 NOTE — Progress Notes (Signed)
Diagnosis: COVID-19  Physician: Dr. Joya Gaskins  Procedure: [facility] Infusion Clinic Med: Sotrovimab infusion- Provided patient with Sotrovimab fact sheet for patients, parents, and caregivers prior to infusion.   Complications: No immediate complications noted. Post infusion patient had temp of 101.6, patient declined Tylenol at time of visit, patient stated she will take Tylenol when she gets home.   Discharge: Discharged home   Terence Lux 08/23/2020

## 2020-08-23 NOTE — Discharge Instructions (Signed)

## 2020-08-23 NOTE — Progress Notes (Signed)
I connected by phone with Shelly Reynolds on 08/23/2020 at 9:45 AM to discuss the potential use of a new treatment for mild to moderate COVID-19 viral infection in non-hospitalized patients.  This patient is a 64 y.o. female that meets the FDA criteria for Emergency Use Authorization of COVID monoclonal antibody Sotrovimab.  Has a (+) direct SARS-CoV-2 viral test result  Has mild or moderate COVID-19   Is NOT hospitalized due to COVID-19  Is within 10 days of symptom onset  Has at least one of the high risk factor(s) for progression to severe COVID-19 and/or hospitalization as defined in EUA.  Specific high risk criteria : BMI > 25   I have spoken and communicated the following to the patient or parent/caregiver regarding COVID monoclonal antibody treatment:  1. FDA has authorized the emergency use for the treatment of mild to moderate COVID-19 in adults and pediatric patients with positive results of direct SARS-CoV-2 viral testing who are 89 years of age and older weighing at least 40 kg, and who are at high risk for progressing to severe COVID-19 and/or hospitalization.  2. The significant known and potential risks and benefits of COVID monoclonal antibody, and the extent to which such potential risks and benefits are unknown.  3. Information on available alternative treatments and the risks and benefits of those alternatives, including clinical trials.  4. Patients treated with COVID monoclonal antibody should continue to self-isolate and use infection control measures (e.g., wear mask, isolate, social distance, avoid sharing personal items, clean and disinfect "high touch" surfaces, and frequent handwashing) according to CDC guidelines.   5. The patient or parent/caregiver has the option to accept or refuse COVID monoclonal antibody treatment.  After reviewing this information with the patient, the patient has agreed to receive one of the available covid 19 monoclonal antibodies and  will be provided an appropriate fact sheet prior to infusion.  Murray Hodgkins, NP 08/23/2020 9:45 AM

## 2020-08-25 ENCOUNTER — Other Ambulatory Visit: Payer: Self-pay

## 2020-08-25 ENCOUNTER — Telehealth (INDEPENDENT_AMBULATORY_CARE_PROVIDER_SITE_OTHER): Payer: No Typology Code available for payment source | Admitting: Family Medicine

## 2020-08-25 DIAGNOSIS — U071 COVID-19: Secondary | ICD-10-CM | POA: Diagnosis not present

## 2020-08-25 DIAGNOSIS — R059 Cough, unspecified: Secondary | ICD-10-CM | POA: Diagnosis not present

## 2020-08-25 DIAGNOSIS — R519 Headache, unspecified: Secondary | ICD-10-CM

## 2020-08-25 MED ORDER — DOXYCYCLINE HYCLATE 100 MG PO TABS: 100.0000 mg | ORAL_TABLET | Freq: Two times a day (BID) | ORAL | 0 refills | Status: DC

## 2020-08-25 MED ORDER — BENZONATATE 100 MG PO CAPS: 100.0000 mg | ORAL_CAPSULE | Freq: Three times a day (TID) | ORAL | 0 refills | Status: DC | PRN

## 2020-08-25 MED ORDER — ALBUTEROL SULFATE HFA 108 (90 BASE) MCG/ACT IN AERS: 2.0000 | INHALATION_SPRAY | Freq: Four times a day (QID) | RESPIRATORY_TRACT | 0 refills | Status: DC | PRN

## 2020-08-25 NOTE — Patient Instructions (Signed)
-  I sent the medication(s) we discussed to your pharmacy: Meds ordered this encounter  Medications  . doxycycline (VIBRA-TABS) 100 MG tablet    Sig: Take 1 tablet (100 mg total) by mouth 2 (two) times daily.    Dispense:  20 tablet    Refill:  0  . albuterol (PROAIR HFA) 108 (90 Base) MCG/ACT inhaler    Sig: Inhale 2 puffs into the lungs every 6 (six) hours as needed for wheezing or shortness of breath.    Dispense:  1 each    Refill:  0  . benzonatate (TESSALON PERLES) 100 MG capsule    Sig: Take 1 capsule (100 mg total) by mouth 3 (three) times daily as needed.    Dispense:  20 capsule    Refill:  0   Follow-up with your primary care office on Monday.  Call to schedule an appointment.  I hope you are feeling better soon!  Seek in person care promptly if your symptoms worsen, new concerns arise or you are not improving with treatment.  It was nice to meet you today. I help [facility] out with telemedicine visits on Tuesdays and Thursdays and am available for visits on those days. If you have any concerns or questions following this visit please schedule a follow up visit with your Primary Care doctor or seek care at a local urgent care clinic to avoid delays in care.

## 2020-08-25 NOTE — Progress Notes (Signed)
Virtual Visit via Telephone Note  I connected with Shelly Reynolds on 08/25/20 at  4:00 PM EST by telephone and verified that I am speaking with the correct person using two identifiers.   I discussed the limitations, risks, security and privacy concerns of performing an evaluation and management service by telephone and the availability of in person appointments. I also discussed with the patient that there may be a patient responsible charge related to this service. The patient expressed understanding and agreed to proceed.  Location patient: home, [place] Location provider: work or home office Participants present for the call: patient, provider Patient did not have a visit with me in the prior 7 days to address this/these issue(s).   History of Present Illness:  Acute telemedicine visit for COVID19: -Onset: Nov 1st, diagnosed with COVID19, she had MAB infusion on the 9th -Symptoms include: productive cough, lots of sinus congestion and now thick discolored nasal drainage, sinus pain, fatigue, intermittent sob, can feel better one day and then bad again the next day. -gradually improving -Denies: fevers, CP, vomiting, diarrhea (did have initially), inability to get out of bed/eat/drink -Has tried: nothing -Pertinent past medical history:denies any chronic disease -Pertinent medication allergies:nkda -COVID-19 vaccine status: not vaccinated    Observations/Objective: Patient sounds cheerful and well on the phone. I do not appreciate any SOB. Speech and thought processing are grossly intact. Patient reported vitals:  Assessment and Plan:  COVID-19  Cough  Facial pain  -we discussed possible serious and likely etiologies, options for evaluation and workup, limitations of telemedicine visit vs in person visit, treatment, treatment risks and precautions. Pt prefers to treat via telemedicine empirically rather than in person at this moment.  She is on about day 11 of 12 with Covid  infection.  In some ways feels is improving in terms of shortness of breath.  However, she feels the sinus issues have worsened and now has thick discolored sinus drainage and sinus pain.  She feels she has a sinus infection.  She opted for empiric treatment with doxycycline 100 mg twice daily for 10 days for possible secondary sinusitis, trial of albuterol inhaler and Tessalon for cough.  She still does not feel like she is able to return to work.  Advised to follow-up with PCP in 2 to 3 days. Scheduled follow up with PCP offered: sent message to schedulers to assist and advised patient to contact PCP office to schedule if does not receive call back in next 24 hours. Advised to seek prompt in person care if worsening, new symptoms arise, or if is not improving with treatment. Advised of options for inperson care in case PCP office not available. Did let the patient know that I only do telemedicine shifts for [place] on Tuesdays and Thursdays and advised a follow up visit with PCP or at an [facility] if has further questions or concerns.   Follow Up Instructions:  I did not refer this patient for an OV with me in the next 24 hours for this/these issue(s).  I discussed the assessment and treatment plan with the patient. The patient was provided an opportunity to ask questions and all were answered. The patient agreed with the plan and demonstrated an understanding of the instructions.   I spent 19 minutes on this encounter.   Lucretia Kern, DO

## 2020-08-30 ENCOUNTER — Telehealth (INDEPENDENT_AMBULATORY_CARE_PROVIDER_SITE_OTHER): Payer: No Typology Code available for payment source | Admitting: Physician Assistant

## 2020-08-30 ENCOUNTER — Encounter: Payer: Self-pay | Admitting: Physician Assistant

## 2020-08-30 VITALS — Ht 59.0 in | Wt 137.0 lb

## 2020-08-30 DIAGNOSIS — R739 Hyperglycemia, unspecified: Secondary | ICD-10-CM

## 2020-08-30 DIAGNOSIS — U071 COVID-19: Secondary | ICD-10-CM

## 2020-08-30 DIAGNOSIS — E663 Overweight: Secondary | ICD-10-CM

## 2020-08-30 MED ORDER — OZEMPIC (0.25 OR 0.5 MG/DOSE) 2 MG/1.5ML ~~LOC~~ SOPN: 0.5000 mg | PEN_INJECTOR | SUBCUTANEOUS | 1 refills | Status: DC

## 2020-08-30 MED ORDER — IBUPROFEN 800 MG PO TABS: 800.0000 mg | ORAL_TABLET | Freq: Three times a day (TID) | ORAL | 1 refills | Status: DC | PRN

## 2020-08-30 NOTE — Progress Notes (Signed)
Virtual Visit via Video   I connected with Shelly Reynolds on 08/30/20 at  2:00 PM EST by a video enabled telemedicine application and verified that I am speaking with the correct person using two identifiers. Location patient: Home Location provider: [facility], Office Persons participating in the virtual visit: Shelly Reynolds, [place] PA-C, Shelly Pickler, LPN   I discussed the limitations of evaluation and management by telemedicine and the availability of in person appointments. The patient expressed understanding and agreed to proceed.  I acted as a Education administrator for [employer], PA-C [employer], LPN   Subjective:   HPI:   COVID Pt is 2 weeks out from having COVID, diagnosed on November 1st, had an infusion on the 9th. Pt says she is feeling better went back to work yesterday. Denies cough, significant SOB, chest pain, fatigue. Still has loss of smell. She is getting back into her regular activity. She saw Shelly Reynolds virtually on 11/11 and took oral doxycycline as prescribed.  She is s/p gastric bypass and cannot tolerate NSAIDs. However, she states that tylenol does not manage her aches and pains, and that this was especially apparent during her COVID-19 infection.  Hyperglycemia/Overweight She is currently on Ozempic 0.5 mg weekly. She is doing very well with this. She had some slight increase in weight loss due to covid-19 infection. She would like to continue this medication.  Body mass index is 27.67 kg/m.   ROS: See pertinent positives and negatives per HPI.  Patient Active Problem List   Diagnosis Date Noted  . Overweight (BMI 25.0-29.9)   . Ischial bursitis of right side 01/29/2020  . Chronic pain of left knee 05/21/2017  . S/P gastric bypass 05/21/2017  . 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 Smoking status: Never Smoker  . Smokeless tobacco: Never Used  Substance Use Topics  . Alcohol use: Yes     Comment: OCCASSIONAL    Current Outpatient Medications:  .  Semaglutide,0.25 or 0.5MG /DOS, (OZEMPIC, 0.25 OR 0.5 MG/DOSE,) 2 MG/1.5ML SOPN, Inject 0.5 mg into the skin once a week., Disp: 4 mL, Rfl: 1 .  albuterol (PROAIR HFA) 108 (90 Base) MCG/ACT inhaler, Inhale 2 puffs into the lungs every 6 (six) hours as needed for wheezing or shortness of breath. (Patient not taking: Reported on 08/30/2020), Disp: 1 each, Rfl: 0 .  ibuprofen (ADVIL) 800 MG tablet, Take 1 tablet (800 mg total) by mouth every 8 (eight) hours as needed., Disp: 30 tablet, Rfl: 1  No Known Allergies  Objective:   VITALS: Per patient if applicable, see vitals. GENERAL: Alert, appears well and in no acute distress. HEENT: Atraumatic, conjunctiva clear, no obvious abnormalities on inspection of external nose and ears. NECK: Normal movements of the head and neck. CARDIOPULMONARY: No increased WOB. Speaking in clear sentences. I:E ratio WNL.  MS: Moves all visible extremities without noticeable abnormality. PSYCH: Pleasant and cooperative, well-groomed. Speech normal rate and rhythm. Affect is appropriate. Insight and judgement are appropriate. Attention is focused, linear, and appropriate.  NEURO: CN grossly intact. Oriented as arrived to appointment on time with no prompting. Moves both UE equally.  SKIN: No obvious lesions, wounds, erythema, or cyanosis noted on face or hands.  Assessment and Plan:   Shelly Reynolds was seen today for covid f/u.  Diagnoses and all orders for this visit:  COVID-19 She is doing extremely well. No red flags on discussion. Patient instructed to avoid NSAIDs, but  per her request, will send in ibuprofen 800 mg for her. She is aware that taking this medication may cause GI ulcer.  Hyperglycemia; Overweight (BMI 25.0-29.9) She is doing great with Ozempic 0.5 mg. Will continue rx, but asked her to follow-up in 3 months so we can update blood work.  Other orders -     ibuprofen (ADVIL) 800 MG  tablet; Take 1 tablet (800 mg total) by mouth every 8 (eight) hours as needed. -     Semaglutide,0.25 or 0.5MG /DOS, (OZEMPIC, 0.25 OR 0.5 MG/DOSE,) 2 MG/1.5ML SOPN; Inject 0.5 mg into the skin once a week.    I discussed the assessment and treatment plan with the patient. The patient was provided an opportunity to ask questions and all were answered. The patient agreed with the plan and demonstrated an understanding of the instructions.   The patient was advised to call back or seek an in-person evaluation if the symptoms worsen or if the condition fails to improve as anticipated.   CMA or LPN served as scribe during this visit. History, Physical, and Plan performed by medical provider. The above documentation has been reviewed and is accurate and complete.  [place], [place] 08/30/2020

## 2020-09-15 ENCOUNTER — Other Ambulatory Visit: Payer: Self-pay | Admitting: Physician Assistant

## 2020-09-15 DIAGNOSIS — Z1231 Encounter for screening mammogram for malignant neoplasm of breast: Secondary | ICD-10-CM

## 2020-09-19 ENCOUNTER — Encounter: Payer: Self-pay | Admitting: Physician Assistant

## 2020-09-19 ENCOUNTER — Ambulatory Visit: Payer: No Typology Code available for payment source | Admitting: Physician Assistant

## 2020-09-19 ENCOUNTER — Other Ambulatory Visit: Payer: Self-pay

## 2020-09-19 VITALS — BP 107/72 | HR 77 | Temp 97.9°F | Ht 59.0 in | Wt 141.0 lb

## 2020-09-19 DIAGNOSIS — R1084 Generalized abdominal pain: Secondary | ICD-10-CM

## 2020-09-19 NOTE — Patient Instructions (Signed)
It was great to see you!  Update blood work and urine test today.  I will be in touch with your results and the plan for imaging.  Take care,  Inda Coke PA-C

## 2020-09-19 NOTE — Progress Notes (Signed)
Shelly Reynolds is a 64 y.o. female here for a new problem.  History of Present Illness:   Chief Complaint  Patient presents with  . Abdominal Pain    gall bladder issues, pt states that she doesn't have any problems until she eat. belly button pain, bubbling stomach, pain in her right side, discomfort, gas, burping x 2/3 weeks     HPI   Abdominal pain Reports abdominal pain for the past 2-3 weeks. Symptoms are in lower abdomen and around umbilicus.  Symptoms are discomfort, gas and burping. Symptoms have been going on every other day. Has had changes to BM -- now having urgency. Had infrequent BM's are first but now having more regular BM's, and stools are more loose than normal. Has not tried anything for her symptoms. She is UTD on colonoscopy. Having abdominal fullness even with very small PO intake.  Denies: No unintentional weight loss, rectal bleeding, vomiting, pelvic pain, dysuria.  Hx of gastric surgery, abdominal hysterectomy, tummy tuck.  Wt Readings from Last 4 Encounters:  09/19/20 141 lb (64 kg)  08/30/20 137 lb (62.1 kg)  04/06/20 151 lb 9.6 oz (68.8 kg)  04/01/20 154 lb (69.9 kg)      Past Medical History:  Diagnosis Date  . Arthritis   . COVID-19 virus infection 08/2020  . Depression   . History of gastric bypass 2009  . Overweight (BMI 25.0-29.9)   . Sleep apnea    does not use CPAP now had gastric bypass  . Urine incontinence      Social History   Tobacco Use  . Smoking status: Never Smoker  . Smokeless tobacco: Never Used  Substance Use Topics  . Alcohol use: Yes    Comment: OCCASSIONAL  . Drug use: No    Past Surgical History:  Procedure Laterality Date  . ABDOMINAL HYSTERECTOMY  2011  . BLADDER REPAIR    . BREAST REDUCTION SURGERY    . GASTRIC BYPASS    . LIPOMA EXCISION Left 10/27/2019   Procedure: EXCISION OF LEFT THIGH LIPOMA;  Surgeon: Erroll Luna, MD;  Location: [facility];  Service: General;  Laterality: Left;   . REDUCTION MAMMAPLASTY Bilateral   . TONSILLECTOMY AND ADENOIDECTOMY Bilateral 1962  . TUBAL LIGATION    . TUMMY TUCK N/A     Family History  Problem Relation Age of Onset  . Arthritis Mother   . Cancer Mother   . Heart disease Mother   . Diabetes Mother   . Breast cancer Mother        93s  . Heart disease Father   . Hypertension Father   . Alcohol abuse Brother   . Alcohol abuse Maternal Uncle   . Alcohol abuse Maternal Grandmother   . Breast cancer Maternal Aunt 35    No Known Allergies  Current Medications:   Current Outpatient Medications:  .  ibuprofen (ADVIL) 800 MG tablet, Take 1 tablet (800 mg total) by mouth every 8 (eight) hours as needed., Disp: 30 tablet, Rfl: 1 .  Semaglutide,0.25 or 0.5MG /DOS, (OZEMPIC, 0.25 OR 0.5 MG/DOSE,) 2 MG/1.5ML SOPN, Inject 0.5 mg into the skin once a week., Disp: 4 mL, Rfl: 1   Review of Systems:   ROS  Negative unless otherwise specified per HPI.  Vitals:   Vitals:   09/19/20 1448  BP: 107/72  Pulse: 77  Temp: 97.9 F (36.6 C)  TempSrc: Temporal  SpO2: 98%  Weight: 141 lb (64 kg)  Height: 4\' 11"  (1.499  m)     Body mass index is 28.48 kg/m.  Physical Exam:   Physical Exam Vitals and nursing note reviewed.  Constitutional:      General: She is not in acute distress.    Appearance: She is well-developed. She is not ill-appearing or toxic-appearing.  Cardiovascular:     Rate and Rhythm: Normal rate and regular rhythm.     Pulses: Normal pulses.     Heart sounds: Normal heart sounds, S1 normal and S2 normal.     Comments: No LE edema Pulmonary:     Effort: Pulmonary effort is normal.     Breath sounds: Normal breath sounds.  Abdominal:     General: Abdomen is flat. Bowel sounds are increased.     Palpations: Abdomen is soft.     Tenderness: There is generalized abdominal tenderness. There is no guarding or rebound. Negative signs include Murphy's sign.  Skin:    General: Skin is warm and dry.  Neurological:      Mental Status: She is alert.     GCS: GCS eye subscore is 4. GCS verbal subscore is 5. GCS motor subscore is 6.  Psychiatric:        Speech: Speech normal.        Behavior: Behavior normal. Behavior is cooperative.      Assessment and Plan:   Kristol was seen today for abdominal pain.  Diagnoses and all orders for this visit:  Generalized abdominal pain No evidence of acute abdomen on exam. DDx includes: H pylori, GERD, constipation, PUD, IBS, gallbladder issues, diverticulitis, among others. Update blood work and urinary test today. Consider abdominal u/s vs abd CT based on results of scan. Worsening symptoms warrant ER evaluation in the interim. -     H. pylori breath test; Future -     CBC with Differential/Platelet; Future -     Comprehensive metabolic panel; Future -     Lipase; Future -     Urinalysis, Routine w reflex microscopic; Future -     Urine Culture; Future -     Urine Culture -     Urinalysis, Routine w reflex microscopic -     Lipase -     Comprehensive metabolic panel -     CBC with Differential/Platelet -     H. pylori breath test  Inda Coke, PA-C

## 2020-09-21 ENCOUNTER — Other Ambulatory Visit: Payer: Self-pay | Admitting: Physician Assistant

## 2020-09-21 MED ORDER — CEPHALEXIN 500 MG PO CAPS: 500.0000 mg | ORAL_CAPSULE | Freq: Two times a day (BID) | ORAL | 0 refills | Status: DC

## 2020-09-22 LAB — URINALYSIS, ROUTINE W REFLEX MICROSCOPIC
Bacteria, UA: NONE SEEN /HPF
Bilirubin Urine: NEGATIVE
Glucose, UA: NEGATIVE
Hgb urine dipstick: NEGATIVE
Hyaline Cast: NONE SEEN /LPF
Ketones, ur: NEGATIVE
Nitrite: NEGATIVE
Protein, ur: NEGATIVE
Specific Gravity, Urine: 1.028 (ref 1.001–1.03)
pH: <=5 (ref 5.0–8.0)

## 2020-09-22 LAB — COMPREHENSIVE METABOLIC PANEL
AG Ratio: 1.7 (calc) (ref 1.0–2.5)
ALT: 15 U/L (ref 6–29)
AST: 20 U/L (ref 10–35)
Albumin: 4.1 g/dL (ref 3.6–5.1)
Alkaline phosphatase (APISO): 94 U/L (ref 37–153)
BUN: 19 mg/dL (ref 7–25)
CO2: 26 mmol/L (ref 20–32)
Calcium: 9 mg/dL (ref 8.6–10.4)
Chloride: 106 mmol/L (ref 98–110)
Creat: 0.69 mg/dL (ref 0.50–0.99)
Globulin: 2.4 g/dL (calc) (ref 1.9–3.7)
Glucose, Bld: 76 mg/dL (ref 65–99)
Potassium: 4.3 mmol/L (ref 3.5–5.3)
Sodium: 139 mmol/L (ref 135–146)
Total Bilirubin: 0.3 mg/dL (ref 0.2–1.2)
Total Protein: 6.5 g/dL (ref 6.1–8.1)

## 2020-09-22 LAB — CBC WITH DIFFERENTIAL/PLATELET
Absolute Monocytes: 540 cells/uL (ref 200–950)
Basophils Absolute: 72 cells/uL (ref 0–200)
Basophils Relative: 1.1 %
Eosinophils Absolute: 98 cells/uL (ref 15–500)
Eosinophils Relative: 1.5 %
HCT: 35.9 % (ref 35.0–45.0)
Hemoglobin: 12.1 g/dL (ref 11.7–15.5)
Lymphs Abs: 2178 cells/uL (ref 850–3900)
MCH: 29.2 pg (ref 27.0–33.0)
MCHC: 33.7 g/dL (ref 32.0–36.0)
MCV: 86.5 fL (ref 80.0–100.0)
MPV: 10.3 fL (ref 7.5–12.5)
Monocytes Relative: 8.3 %
Neutro Abs: 3614 cells/uL (ref 1500–7800)
Neutrophils Relative %: 55.6 %
Platelets: 278 10*3/uL (ref 140–400)
RBC: 4.15 10*6/uL (ref 3.80–5.10)
RDW: 15.1 % — ABNORMAL HIGH (ref 11.0–15.0)
Total Lymphocyte: 33.5 %
WBC: 6.5 10*3/uL (ref 3.8–10.8)

## 2020-09-22 LAB — URINE CULTURE
MICRO NUMBER:: 11280935
SPECIMEN QUALITY:: ADEQUATE

## 2020-09-22 LAB — LIPASE: Lipase: 58 U/L (ref 7–60)

## 2020-09-22 LAB — H. PYLORI BREATH TEST: H. pylori Breath Test: NOT DETECTED

## 2020-10-10 ENCOUNTER — Encounter: Payer: Self-pay | Admitting: Physician Assistant

## 2020-10-11 ENCOUNTER — Other Ambulatory Visit: Payer: Self-pay | Admitting: Physician Assistant

## 2020-10-11 DIAGNOSIS — R1084 Generalized abdominal pain: Secondary | ICD-10-CM

## 2020-10-12 ENCOUNTER — Ambulatory Visit (HOSPITAL_COMMUNITY)
Admission: RE | Admit: 2020-10-12 | Discharge: 2020-10-12 | Disposition: A | Discharge status: Home / Self Care | Payer: BLUE CROSS/BLUE SHIELD | Source: Ambulatory Visit | Attending: Physician Assistant | Admitting: Physician Assistant

## 2020-10-12 ENCOUNTER — Other Ambulatory Visit: Payer: Self-pay

## 2020-10-12 DIAGNOSIS — R1084 Generalized abdominal pain: Secondary | ICD-10-CM | POA: Insufficient documentation

## 2020-10-12 MED ORDER — IOHEXOL 300 MG/ML  SOLN
100.0000 mL | Freq: Once | INTRAMUSCULAR | Status: AC | PRN
  Administered 2020-10-12: 100 mL via INTRAVENOUS

## 2020-10-13 ENCOUNTER — Encounter: Payer: Self-pay | Admitting: Physician Assistant

## 2020-10-13 DIAGNOSIS — R1084 Generalized abdominal pain: Secondary | ICD-10-CM

## 2020-11-01 ENCOUNTER — Other Ambulatory Visit: Payer: Self-pay

## 2020-11-01 ENCOUNTER — Ambulatory Visit
Admission: RE | Admit: 2020-11-01 | Discharge: 2020-11-01 | Disposition: A | Discharge status: Home / Self Care | Payer: No Typology Code available for payment source | Source: Ambulatory Visit | Attending: Physician Assistant | Admitting: Physician Assistant

## 2020-11-01 DIAGNOSIS — Z1231 Encounter for screening mammogram for malignant neoplasm of breast: Secondary | ICD-10-CM

## 2020-12-06 ENCOUNTER — Ambulatory Visit: Payer: No Typology Code available for payment source | Admitting: Gastroenterology

## 2020-12-06 ENCOUNTER — Encounter: Payer: Self-pay | Admitting: Gastroenterology

## 2020-12-06 ENCOUNTER — Other Ambulatory Visit: Payer: Self-pay

## 2020-12-06 VITALS — BP 114/70 | HR 80 | Ht 58.5 in | Wt 144.2 lb

## 2020-12-06 DIAGNOSIS — Z860100 Personal history of colon polyps, unspecified: Secondary | ICD-10-CM

## 2020-12-06 DIAGNOSIS — R1013 Epigastric pain: Secondary | ICD-10-CM

## 2020-12-06 DIAGNOSIS — R112 Nausea with vomiting, unspecified: Secondary | ICD-10-CM

## 2020-12-06 DIAGNOSIS — R110 Nausea: Secondary | ICD-10-CM | POA: Diagnosis not present

## 2020-12-06 NOTE — Patient Instructions (Addendum)
It has been recommended to you by your physician that you have a(n) Colonoscopy/Endoscopy completed. (Colonoscopy pending record review) Per your request, we did not schedule the procedure(s) today. Please contact our office at [phone] should you decide to have the procedure completed. You will be scheduled for a pre-visit and procedure at that time.  You will be contacted by [facility] in the next 2 days to arrange a HIDA SCAN.  The number on your caller ID will be [phone], please answer when they call.  If you have not heard from them in 2 days please call [phone] to schedule.    Follow up in July  Use OTC FD Donald Prose 1 capsule three times a day as needed  Due to recent changes in healthcare laws, you may see the results of your imaging and laboratory studies on MyChart before your provider has had a chance to review them.  We understand that in some cases there may be results that are confusing or concerning to you. Not all laboratory results come back in the same time frame and the provider may be waiting for multiple results in order to interpret others.  Please give [place] 48 hours in order for your provider to thoroughly review all the results before contacting the office for clarification of your results.   I appreciate the  opportunity to care for you  Thank You   Harl Bowie , MD

## 2020-12-06 NOTE — Progress Notes (Signed)
Shelly Reynolds    [id number]    [birth year]  Primary Care Physician:[place], [place], [place]  Referring Physician: Inda Coke, [place],  [place] [address]   Chief complaint: Generalized abdominal pain, nausea HPI:  65 year old very pleasant female here for evaluation of abdominal pain and nausea  She had CT abdomen and pelvis ordered by PMD which showed no acute abnormality , small nephrolithiasis and postoperative changes from ventral hernia repair Uses ibuprofen intermittently  She has history of colon polyps and she is s/p gastric bypass surgery.  She has intermittent abdominal bloating and cramping.   CT abd & pelvis 10/12/20 No acute abnormality. Postoperative changes of the ventral abdominal wall. No periumbilical hernia. 2 mm left renal nonobstructing calculus. Mild colonic diverticulosis.  Outpatient Encounter Medications as of 12/06/2020  Medication Sig  . ibuprofen (ADVIL) 800 MG tablet Take 1 tablet (800 mg total) by mouth every 8 (eight) hours as needed.  . Semaglutide,0.25 or 0.5MG /DOS, (OZEMPIC, 0.25 OR 0.5 MG/DOSE,) 2 MG/1.5ML SOPN Inject 0.5 mg into the skin once a week.  . [DISCONTINUED] cephALEXin (KEFLEX) 500 MG capsule Take 1 capsule (500 mg total) by mouth 2 (two) times daily.   No facility-administered encounter medications on file as of 12/06/2020.    Allergies as of 12/06/2020  . (No Known Allergies)    Past Medical History:  Diagnosis Date  . Arthritis   . COVID-19 virus infection 08/2020  . Depression   . History of gastric bypass 2009  . Overweight (BMI 25.0-29.9)   . Sleep apnea    does not use CPAP now had gastric bypass  . Urine incontinence     Past Surgical History:  Procedure Laterality Date  . ABDOMINAL HYSTERECTOMY  2011  . BLADDER REPAIR    . BREAST REDUCTION SURGERY    . GASTRIC BYPASS    . LIPOMA EXCISION Left 10/27/2019   Procedure: EXCISION OF LEFT THIGH LIPOMA;  Surgeon: Erroll Luna, MD;  Location: [facility];  Service: General;  Laterality: Left;  . TONSILLECTOMY AND ADENOIDECTOMY Bilateral 1962  . TUBAL LIGATION    . TUMMY TUCK N/A     Family History  Problem Relation Age of Onset  . Arthritis Mother   . Heart disease Mother   . Diabetes Mother   . Breast cancer Mother        46s  . Heart disease Father   . Hypertension Father   . Alcohol abuse Brother   . Alcohol abuse Maternal Uncle   . Alcohol abuse Maternal Grandmother   . Breast cancer Maternal Aunt 35  . Diabetes Brother   . ALS Son   . Other Daughter        Overdose    Social History   Socioeconomic History  . Marital status: Married    Spouse name: Not on file  . Number of children: 5  . Years of education: Not on file  . Highest education level: Not on file  Occupational History  . Occupation: retired  Tobacco Use  . Smoking status: Never Smoker  . Smokeless tobacco: Never Used  Vaping Use  . Vaping Use: Never used  Substance and Sexual Activity  . Alcohol use: Yes    Comment: OCCASSIONAL  . Drug use: No  . Sexual activity: Yes    Comment: HYSTERECTOMY  Other Topics Concern  . Not on file  Social History Narrative   Works  at [employer] in the [employer]   Social Determinants of Health   Financial Resource Strain: Not on file  Food Insecurity: Not on file  Transportation Needs: Not on file  Physical Activity: Not on file  Stress: Not on file  Social Connections: Not on file  Intimate Partner Violence: Not on file      Review of systems: All other review of systems negative except as mentioned in the HPI.   Physical Exam: Vitals:   12/06/20 1420  BP: 114/70  Pulse: 80   Body mass index is 29.64 kg/m. Gen:      No acute distress HEENT:  sclera anicteric Abd:      soft, non-tender; no palpable masses, no distension Ext:    No edema Neuro: alert and oriented x 3 Psych: normal mood and affect  Data Reviewed:  Reviewed labs,  radiology imaging, old records and pertinent past GI work up   Assessment and Plan/Recommendations:  65 year old very pleasant female with complaints of nausea and generalized abdominal pain associated with cramping and bloating  CT abdomen pelvis 12 /2021 showed small nephrolithiasis otherwise unremarkable exam, no acute pathology  He is s/p gastric bypass and has history of adenomatous colon polyps She had hysterectomy and bilateral oophorectomy  Need to exclude anastomotic ulcer or severe gastroduodenitis.  She is due for surveillance colonoscopy.  We will try to obtain records from previous GI Plan to proceed with EGD and colonoscopy.  Patient wanted to wait until June to schedule the procedures due to her work Continue with small frequent meals  Nausea and dyspepsia: We will obtain HIDA scan to exclude gallbladder disease Use FD guard 1 capsule up to 3 times daily as needed  Return after EGD and colonoscopy  The risks and benefits as well as alternatives of endoscopic procedure(s) have been discussed and reviewed. All questions answered. The patient agrees to proceed.   The patient was provided an opportunity to ask questions and all were answered. The patient agreed with the plan and demonstrated an understanding of the instructions.  Damaris Hippo , MD    CC: Inda Coke, [place]

## 2020-12-19 ENCOUNTER — Telehealth: Payer: Self-pay | Admitting: *Deleted

## 2020-12-20 ENCOUNTER — Encounter: Payer: Self-pay | Admitting: Gastroenterology

## 2020-12-21 NOTE — Telephone Encounter (Signed)
Records release faxed back with no patient found from [facility]

## 2021-01-11 ENCOUNTER — Other Ambulatory Visit: Payer: Self-pay | Admitting: Physician Assistant

## 2021-01-31 ENCOUNTER — Encounter: Payer: Self-pay | Admitting: Family Medicine

## 2021-01-31 ENCOUNTER — Ambulatory Visit (INDEPENDENT_AMBULATORY_CARE_PROVIDER_SITE_OTHER): Payer: No Typology Code available for payment source

## 2021-01-31 ENCOUNTER — Other Ambulatory Visit: Payer: Self-pay

## 2021-01-31 ENCOUNTER — Ambulatory Visit (INDEPENDENT_AMBULATORY_CARE_PROVIDER_SITE_OTHER): Payer: No Typology Code available for payment source | Admitting: Family Medicine

## 2021-01-31 VITALS — BP 112/78 | HR 74 | Temp 98.0°F | Ht 58.5 in | Wt 144.2 lb

## 2021-01-31 DIAGNOSIS — L989 Disorder of the skin and subcutaneous tissue, unspecified: Secondary | ICD-10-CM | POA: Diagnosis not present

## 2021-01-31 DIAGNOSIS — M79671 Pain in right foot: Secondary | ICD-10-CM | POA: Diagnosis not present

## 2021-01-31 NOTE — Progress Notes (Signed)
   Shelly Reynolds is a 65 y.o. female who presents today for an office visit.  Assessment/Plan:  New/Acute Problems: Right Foot Pain Likely midfoot sprain though given length of symptoms we will check x-ray to rule out other possible causes.  We will avoid NSAIDs given her history of gastric bypass.  Place patient in postop shoe today for support and comfort.  Given lack of improvement over the last couple of months will place referral to sports medicine for further evaluation.  Discussed reasons to return to care.  She can take Tylenol as needed.  Recommended Voltaren gel as well.  Skin lesion Will place referral to dermatology per patient request.    Subjective:  HPI:  Patient here with right foot pain.  Started a couple of months ago.  She injured it while at work.  She tripped over a bread rack.  Immediately noted pain to medial aspect of right foot.  She thought the pain would go away however has persisted over the last several months.  Worse with walking.  She has tried ibuprofen with no improvement.  Better with rest.  She has noticed some swelling to the medial aspect of her right foot as well.  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  She requests referral to dermatology.  Is been there for several years but has become more crusted lately.       Objective:  Physical Exam: BP 112/78   Pulse 74   Temp 98 F (36.7 C)   Ht 4' 10.5" (1.486 m)   Wt 144 lb 4 oz (65.4 kg)   LMP 01/23/2006 (Exact Date)   SpO2 98%   BMI 29.64 kg/m   Gen: No acute distress, resting comfortably MSK: - Foot: No deformities.  Tender to palpation along base of first metatarsal and navicular area.  Neurovascularly intact distally. Neuro: Grossly normal, moves all extremities Psych: Normal affect and thought content      Deyra Perdomo M. Jerline Pain, MD 01/31/2021 12:26 PM

## 2021-01-31 NOTE — Patient Instructions (Signed)
It was very nice to see you today!  I think you have probably sprained tore the ligaments in your foot.  We will check x-rays today.  Please use the rigid soled shoe as much as you can.  I would like for you to follow-up with a sports medicine doctor.  Take care, Dr Jerline Pain  PLEASE NOTE:  If you had any lab tests please let us know if you have not heard back within a few days. You may see your results on mychart before we have a chance to review them but we will give you a call once they are reviewed by [place]. If we ordered any referrals today, please let us know if you have not heard from their office within the next week.   Please try these tips to maintain a healthy lifestyle:   Eat at least 3 REAL meals and 1-2 snacks per day.  Aim for no more than 5 hours between eating.  If you eat breakfast, please do so within one hour of getting up.    Each meal should contain half fruits/vegetables, one quarter protein, and one quarter carbs (no bigger than a computer mouse)   Cut down on sweet beverages. This includes juice, soda, and sweet tea.     Drink at least 1 glass of water with each meal and aim for at least 8 glasses per day   Exercise at least 150 minutes every week.

## 2021-02-02 ENCOUNTER — Encounter: Payer: Self-pay | Admitting: Gastroenterology

## 2021-02-02 ENCOUNTER — Encounter: Payer: Self-pay | Admitting: Physician Assistant

## 2021-02-02 NOTE — Telephone Encounter (Signed)
Dr. Jerline Pain, pt asking for Foot x-ray results?

## 2021-02-02 NOTE — Telephone Encounter (Signed)
Pt aware see result notes. 

## 2021-02-02 NOTE — Progress Notes (Signed)
Please inform patient of the following:  X-ray shows arthritis in her foot.  No fractures or dislocations.  Recommend follow-up with sports medicine as we discussed at her office visit.

## 2021-02-13 ENCOUNTER — Encounter: Payer: Self-pay | Admitting: Gastroenterology

## 2021-02-13 ENCOUNTER — Telehealth: Payer: Self-pay

## 2021-02-13 DIAGNOSIS — D229 Melanocytic nevi, unspecified: Secondary | ICD-10-CM

## 2021-02-13 NOTE — Telephone Encounter (Signed)
Spoke to pt asked her what she wanting Dermatology referral for? Pt said she has some moles she wants checked. Told her okay will send referral to [facility] as requested and someone will contact you to schedule. Pt verbalized understanding. Referral placed in Epic.

## 2021-02-13 NOTE — Telephone Encounter (Signed)
Patient would like her Dermatology referral sent to [facility]

## 2021-03-30 ENCOUNTER — Other Ambulatory Visit: Payer: Self-pay

## 2021-03-30 ENCOUNTER — Ambulatory Visit (AMBULATORY_SURGERY_CENTER): Payer: No Typology Code available for payment source

## 2021-03-30 VITALS — Ht <= 58 in | Wt 145.0 lb

## 2021-03-30 DIAGNOSIS — R110 Nausea: Secondary | ICD-10-CM

## 2021-03-30 DIAGNOSIS — R1013 Epigastric pain: Secondary | ICD-10-CM

## 2021-03-30 NOTE — Progress Notes (Signed)
Pre visit completed via phone call;  Patient verified name, DOB, and address; No egg or soy allergy known to patient  No issues with past sedation with any surgeries or procedures Patient denies ever being told they had issues or difficulty with intubation  No FH of Malignant Hyperthermia No diet pills per patient No home 02 use per patient  No blood thinners per patient  Pt denies issues with constipation  No A fib or A flutter  EMMI video via MyChart  COVID 19 guidelines implemented in PV today with Pt and RN   Due to the COVID-19 pandemic we are asking patients to follow certain guidelines.  Pt aware of COVID protocols and [facility] guidelines   Patient denies loose or missing teeth, dental implants, bonded teeth, dentures; Patient reports capped teeth;

## 2021-04-05 ENCOUNTER — Other Ambulatory Visit: Payer: Self-pay

## 2021-04-05 ENCOUNTER — Ambulatory Visit (AMBULATORY_SURGERY_CENTER): Payer: No Typology Code available for payment source | Admitting: Gastroenterology

## 2021-04-05 ENCOUNTER — Encounter: Payer: Self-pay | Admitting: Gastroenterology

## 2021-04-05 VITALS — BP 130/86 | HR 76 | Temp 98.6°F | Resp 13 | Ht <= 58 in | Wt 145.0 lb

## 2021-04-05 DIAGNOSIS — K2100 Gastro-esophageal reflux disease with esophagitis, without bleeding: Secondary | ICD-10-CM

## 2021-04-05 DIAGNOSIS — R112 Nausea with vomiting, unspecified: Secondary | ICD-10-CM

## 2021-04-05 DIAGNOSIS — R1013 Epigastric pain: Secondary | ICD-10-CM

## 2021-04-05 MED ORDER — SODIUM CHLORIDE 0.9 % IV SOLN: 500.0000 mL | Freq: Once | INTRAVENOUS | Status: DC

## 2021-04-05 MED ORDER — OMEPRAZOLE 40 MG PO CPDR: 40.0000 mg | DELAYED_RELEASE_CAPSULE | Freq: Every day | ORAL | 0 refills | Status: DC

## 2021-04-05 NOTE — Patient Instructions (Signed)
Discharge instructions given. Handout on esophagitis. Resume previous medications. Prescription sent to pharmacy for omeprazole. YOU HAD AN ENDOSCOPIC PROCEDURE TODAY AT [facility]:   Refer to the procedure report that was given to you for any specific questions about what was found during the examination.  If the procedure report does not answer your questions, please call your gastroenterologist to clarify.  If you requested that your care partner not be given the details of your procedure findings, then the procedure report has been included in a sealed envelope for you to review at your convenience later.  YOU SHOULD EXPECT: Some feelings of bloating in the abdomen. Passage of more gas than usual.  Walking can help get rid of the air that was put into your GI tract during the procedure and reduce the bloating. If you had a lower endoscopy (such as a colonoscopy or flexible sigmoidoscopy) you may notice spotting of blood in your stool or on the toilet paper. If you underwent a bowel prep for your procedure, you may not have a normal bowel movement for a few days.  Please Note:  You might notice some irritation and congestion in your nose or some drainage.  This is from the oxygen used during your procedure.  There is no need for concern and it should clear up in a day or so.  SYMPTOMS TO REPORT IMMEDIATELY:   Following upper endoscopy (EGD)  Vomiting of blood or coffee ground material  New chest pain or pain under the shoulder blades  Painful or persistently difficult swallowing  New shortness of breath  Fever of 100F or higher  Black, tarry-looking stools  For urgent or emergent issues, a gastroenterologist can be reached at any hour by calling [phone]. Do not use MyChart messaging for urgent concerns.    DIET:  We do recommend a small meal at first, but then you may proceed to your regular diet.  Drink plenty of fluids but you should avoid alcoholic beverages  for 24 hours.  ACTIVITY:  You should plan to take it easy for the rest of today and you should NOT DRIVE or use heavy machinery until tomorrow (because of the sedation medicines used during the test).    FOLLOW UP: Our staff will call the number listed on your records 48-72 hours following your procedure to check on you and address any questions or concerns that you may have regarding the information given to you following your procedure. If we do not reach you, we will leave a message.  We will attempt to reach you two times.  During this call, we will ask if you have developed any symptoms of COVID 19. If you develop any symptoms (ie: fever, flu-like symptoms, shortness of breath, cough etc.) before then, please call [phone].  If you test positive for Covid 19 in the 2 weeks post procedure, please call and report this information to [place].    If any biopsies were taken you will be contacted by phone or by letter within the next 1-3 weeks.  Please call us at [phone] if you have not heard about the biopsies in 3 weeks.    SIGNATURES/CONFIDENTIALITY: You and/or your care partner have signed paperwork which will be entered into your electronic medical record.  These signatures attest to the fact that that the information above on your After Visit Summary has been reviewed and is understood.  Full responsibility of the confidentiality of this discharge information lies with you and/or your  care-partner.  

## 2021-04-05 NOTE — Progress Notes (Signed)
Vs by CW in adm  Pt's states no medical or surgical changes since previsit or office visit.     

## 2021-04-05 NOTE — Progress Notes (Signed)
1530 Robinul 0.1 mg IV given due large amount of secretions upon assessment.  MD made aware, vss  

## 2021-04-05 NOTE — Op Note (Addendum)
[facility] Patient Name: Shelly Reynolds Procedure Date: 04/05/2021 3:16 PM MRN: [id number] Endoscopist: Mauri Pole , MD Age: 65 Referring MD:  Date of Birth: 1956-01-16 Gender: Female Account #: [id number] Procedure:                Upper GI endoscopy Indications:              Epigastric abdominal pain, Nausea Medicines:                Monitored Anesthesia Care Procedure:                Pre-Anesthesia Assessment:                           - Prior to the procedure, a History and Physical                            was performed, and patient medications and                            allergies were reviewed. The patient's tolerance of                            previous anesthesia was also reviewed. The risks                            and benefits of the procedure and the sedation                            options and risks were discussed with the patient.                            All questions were answered, and informed consent                            was obtained. Prior Anticoagulants: The patient has                            taken no previous anticoagulant or antiplatelet                            agents. ASA Grade Assessment: II - A patient with                            mild systemic disease. After reviewing the risks                            and benefits, the patient was deemed in                            satisfactory condition to undergo the procedure.                           After obtaining informed consent, the endoscope was  passed under direct vision. Throughout the                            procedure, the patient's blood pressure, pulse, and                            oxygen saturations were monitored continuously. The                            Endoscope was introduced through the mouth, and                            advanced to the afferent and efferent jejunal                            loops. The upper GI  endoscopy was accomplished                            without difficulty. The patient tolerated the                            procedure well. Scope In: Scope Out: Findings:                 LA Grade C (one or more mucosal breaks continuous                            between tops of 2 or more mucosal folds, less than                            75% circumference) esophagitis with no bleeding was                            found 33 to 34 cm from the incisors.                           The Z-line was regular and was found 34 cm from the                            incisors.                           Evidence of a gastric bypass was found. A gastric                            pouch with a normal size was found. The staple line                            appeared intact. The gastrojejunal anastomosis was                            characterized by healthy appearing mucosa. The  jejunojejunal anastomosis was characterized by                            healthy appearing mucosa.                           The examined jejunum was normal. Complications:            No immediate complications. Estimated Blood Loss:     Estimated blood loss was minimal. Impression:               - LA Grade C reflux esophagitis with no bleeding.                           - Z-line regular, 34 cm from the incisors.                           - Gastric bypass with a normal-sized pouch and                            intact staple line. Gastrojejunal anastomosis                            characterized by healthy appearing mucosa.                           - Normal examined jejunum.                           - No specimens collected. Recommendation:           - Patient has a contact number available for                            emergencies. The signs and symptoms of potential                            delayed complications were discussed with the                            patient. Return to  normal activities tomorrow.                            Written discharge instructions were provided to the                            patient.                           - Resume previous diet.                           - Continue present medications.                           - Follow an antireflux regimen.                           -  Return to GI office at the next available                            appointment.                           - Use Prilosec (omeprazole) 40 mg PO daily for 3                            months. Mauri Pole, MD 04/05/2021 3:58:58 PM This report has been signed electronically.

## 2021-04-05 NOTE — Progress Notes (Signed)
Report given to PACU, vss 

## 2021-04-07 ENCOUNTER — Telehealth: Payer: Self-pay

## 2021-04-07 NOTE — Telephone Encounter (Signed)
First attempt follow up call to pt, lm on vm 

## 2021-04-07 NOTE — Telephone Encounter (Signed)
No answer for second call back.

## 2021-04-11 ENCOUNTER — Other Ambulatory Visit: Payer: Self-pay

## 2021-07-06 ENCOUNTER — Telehealth: Payer: Self-pay | Admitting: Gastroenterology

## 2021-07-06 NOTE — Telephone Encounter (Signed)
Inbound call from pt requesting a call back stating that she is having some abd pain. Please advise. Thank you.

## 2021-07-06 NOTE — Telephone Encounter (Signed)
Patient calling with c/o RLQ pain described as feeling like "menstrual " cramps. The pain will extend into her hip area and feels like it is going into her back. She is comfortable if she is up and even if she is walking. When she sits or she lies down, the pain returns. Afebrile. No nausea, but has been belching a lot. She does not eat fried or fatty foods. She is taking daily Omeprazole. Please advise.

## 2021-07-07 MED ORDER — DICYCLOMINE HCL 10 MG PO CAPS: 10.0000 mg | ORAL_CAPSULE | Freq: Three times a day (TID) | ORAL | 0 refills | Status: DC | PRN

## 2021-07-07 NOTE — Telephone Encounter (Signed)
Spoke with patient in regards to recommendations. Pt wanted to have HIDA scan, denies any consistent nausea. Advised that HIDA scan was ordered back in 11/2020, advised that she may have missed the call from radiology scheduling. I have provided her with their number to set up appt. She is aware that Dr. Silverio Decamp will further advise after she reviews the results. Pt would like RX sent to pharmacy on file. Pt verbalized understanding of all information and had no concerns at the end of the call.

## 2021-07-07 NOTE — Telephone Encounter (Signed)
   Continue omeprazole and antireflux measures.  Please send prescription for dicyclomine 10 mg every 8 hours as needed X 30 tabs with no refills to help with abdominal cramping  Based on description of the pain, it could be musculoskeletal pain.  Please advise patient to contact PMD if she continues to have persistent pain. She has history of nephrolithiasis and degenerative spine disease.

## 2021-07-11 ENCOUNTER — Ambulatory Visit: Payer: No Typology Code available for payment source | Admitting: Gastroenterology

## 2021-07-12 ENCOUNTER — Ambulatory Visit (INDEPENDENT_AMBULATORY_CARE_PROVIDER_SITE_OTHER): Payer: No Typology Code available for payment source | Admitting: Family Medicine

## 2021-07-12 ENCOUNTER — Other Ambulatory Visit: Payer: Self-pay

## 2021-07-12 VITALS — BP 132/80 | HR 66 | Temp 98.2°F | Ht <= 58 in | Wt 146.0 lb

## 2021-07-12 DIAGNOSIS — M25562 Pain in left knee: Secondary | ICD-10-CM | POA: Diagnosis not present

## 2021-07-12 DIAGNOSIS — Z6830 Body mass index (BMI) 30.0-30.9, adult: Secondary | ICD-10-CM

## 2021-07-12 DIAGNOSIS — R7301 Impaired fasting glucose: Secondary | ICD-10-CM

## 2021-07-12 DIAGNOSIS — E669 Obesity, unspecified: Secondary | ICD-10-CM

## 2021-07-12 DIAGNOSIS — F3289 Other specified depressive episodes: Secondary | ICD-10-CM

## 2021-07-12 DIAGNOSIS — Z9884 Bariatric surgery status: Secondary | ICD-10-CM | POA: Diagnosis not present

## 2021-07-12 DIAGNOSIS — G8929 Other chronic pain: Secondary | ICD-10-CM

## 2021-07-12 MED ORDER — TIRZEPATIDE 2.5 MG/0.5ML ~~LOC~~ SOAJ: 2.5000 mg | SUBCUTANEOUS | 0 refills | Status: DC

## 2021-07-13 ENCOUNTER — Encounter (INDEPENDENT_AMBULATORY_CARE_PROVIDER_SITE_OTHER): Payer: Self-pay | Admitting: Family Medicine

## 2021-07-13 MED ORDER — TIRZEPATIDE 5 MG/0.5ML ~~LOC~~ SOAJ: 5.0000 mg | SUBCUTANEOUS | 0 refills | Status: DC

## 2021-07-13 NOTE — Progress Notes (Signed)
Chief Complaint:   OBESITY Shelly Reynolds is here to discuss her progress with her obesity treatment plan along with follow-up of her obesity related diagnoses.   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  She is married and lives with her spouse and son (64).  She reports being on Ozempic for 2 years.  She says she lost 10-15 pounds and then stopped it.  She will get labs with her PCP at her upcoming CPE.  Assessment/Plan:   1. Impaired fasting glucose Start Mounjaro 2.5 mg subcutaneously weekly for 4 weeks, then increase to 5 mg subcutaneously weekly.  - Start tirzepatide [facility]) 2.5 MG/0.5ML Pen; Inject 2.5 mg into the skin once a week.  Dispense: 2 mL; Refill: 0 - Start tirzepatide [facility]) 5 MG/0.5ML Pen; Inject 5 mg into the skin once a week.  Dispense: 2 mL; Refill: 0  2. Chronic pain of left knee Worsening.  Will follow along as it relates to her weight loss journey.  3. History of gastric bypass Shelly Reynolds is at risk for malnutrition due to her previous bariatric surgery.   Counseling You may need to eat 3 meals and 2 snacks, or 5 small meals each day in order to reach your protein and calorie goals.  Allow at least 15 minutes for each meal so that you can eat mindfully. Listen to your body so that you do not overeat. For most people, your sleeve or pouch will comfortably hold 4-6 ounces. Eat foods from all food groups. This includes fruits and vegetables, grains, dairy, and meat and other proteins. Include a protein-rich food at every meal and snack, and eat the protein food first.  You should be taking a Bariatric Multivitamin as well as calcium.   4. Other depression, with emotional eating Not at goal. Medication: None.  Shelly Reynolds emotionally eats when stressed and when bored.  Plan:  Discussed cues and consequences, how thoughts affect eating, model of thoughts,  feelings, and behaviors, and strategies for change by focusing on the cue. Discussed cognitive distortions, coping thoughts, and how to change your thoughts.  5. Class 1 obesity with serious comorbidity and body mass index (BMI) of 30.0 to 30.9 in adult, unspecified obesity type  Course: Shelly Reynolds is currently in the action stage of change. As such, her goal is to continue with weight loss efforts.   Nutrition goals: She has agreed to practicing portion control and making smarter food choices, such as increasing vegetables and decreasing simple carbohydrates.   Exercise goals: All adults should avoid inactivity. Some physical activity is better than none, and adults who participate in any amount of physical activity gain some health benefits.  Behavioral modification strategies: increasing lean protein intake, decreasing simple carbohydrates, increasing vegetables, increasing water intake, and decreasing liquid calories.  Shelly Reynolds has agreed to follow-up with our clinic in 4 weeks. She was informed of the importance of frequent follow-up visits to maximize her success with intensive lifestyle modifications for her multiple health conditions.   Objective:   Blood pressure 132/80, pulse 66, temperature 98.2 F (36.8 C), temperature source Oral, height 4\' 10"  (1.473 m), weight 146 lb (66.2 kg), last menstrual period 01/23/2006, SpO2 98 %. Body mass index is 30.51 kg/m.  General: Cooperative, alert, well developed, in no acute distress. HEENT: Conjunctivae and lids unremarkable. Cardiovascular: Regular rhythm.  Lungs: Normal work of breathing. Neurologic: No focal deficits.  Lab Results  Component Value Date   CREATININE 0.69 09/19/2020   BUN 19 09/19/2020   NA 139 09/19/2020   K 4.3 09/19/2020   CL 106 09/19/2020   CO2 26 09/19/2020   Lab Results  Component Value Date   ALT 15 09/19/2020   AST 20 09/19/2020   ALKPHOS 94 10/23/2019   BILITOT 0.3 09/19/2020   Lab Results   Component Value Date   TSH 2.51 04/02/2018   Lab Results  Component Value Date   CHOL 193 04/02/2018   HDL 81.40 04/02/2018   LDLCALC 91 04/02/2018   TRIG 103.0 04/02/2018   CHOLHDL 2 04/02/2018   Lab Results  Component Value Date   VD25OH 18 12/27/2016   Lab Results  Component Value Date   WBC 6.5 09/19/2020   HGB 12.1 09/19/2020   HCT 35.9 09/19/2020   MCV 86.5 09/19/2020   PLT 278 09/19/2020   Attestation Statements:   Reviewed by clinician on day of visit: allergies, medications, problem list, medical history, surgical history, family history, social history, and previous encounter notes.  Time spent on visit including pre-visit chart review and post-visit care and charting was 50 minutes.   I, Water quality scientist, CMA, am acting as transcriptionist for Briscoe Deutscher, DO  I have reviewed the above documentation for accuracy and completeness, and I agree with the above. Briscoe Deutscher, DO

## 2021-07-18 ENCOUNTER — Other Ambulatory Visit: Payer: Self-pay

## 2021-07-18 ENCOUNTER — Ambulatory Visit (INDEPENDENT_AMBULATORY_CARE_PROVIDER_SITE_OTHER): Payer: No Typology Code available for payment source | Admitting: Physician Assistant

## 2021-07-18 ENCOUNTER — Encounter: Payer: Self-pay | Admitting: Physician Assistant

## 2021-07-18 VITALS — BP 136/80 | HR 73 | Temp 98.4°F | Ht 59.0 in | Wt 152.0 lb

## 2021-07-18 DIAGNOSIS — E669 Obesity, unspecified: Secondary | ICD-10-CM

## 2021-07-18 DIAGNOSIS — Z6830 Body mass index (BMI) 30.0-30.9, adult: Secondary | ICD-10-CM | POA: Diagnosis not present

## 2021-07-18 DIAGNOSIS — Z0000 Encounter for general adult medical examination without abnormal findings: Secondary | ICD-10-CM | POA: Diagnosis not present

## 2021-07-18 DIAGNOSIS — Z9884 Bariatric surgery status: Secondary | ICD-10-CM

## 2021-07-18 DIAGNOSIS — R7301 Impaired fasting glucose: Secondary | ICD-10-CM

## 2021-07-18 NOTE — Patient Instructions (Addendum)
It was great to see you! ? ?Please go to the lab for blood work.  ? ?Our office will call you with your results unless you have chosen to receive results via MyChart. ? ?If your blood work is normal we will follow-up each year for physicals and as scheduled for chronic medical problems. ? ?If anything is abnormal we will treat accordingly and get you in for a follow-up. ? ?Take care, ? ?Thomasina Housley ?  ? ? ?

## 2021-07-18 NOTE — Progress Notes (Signed)
Subjective:    Shelly Reynolds is a 65 y.o. female and is here for a comprehensive physical exam.   HPI  There are no preventive care reminders to display for this patient.   Acute Concerns: None  Chronic Issues: Obesity/Hx of gastric surgery/Impaired fasting glucose -- [name] has recently seen Dr. Juleen China, 07/12/21, to discuss progress with her obesity treatment plan. She was recently prescribed mounjaro 2.5 mg but has not started it due to upcoming [place].  She has also admitted to not beginning the diet that was planned for her as of yet. [name] will be seeing Dr. Juleen China every two months.   After her gastric bypass she was taking vitamin B and iron supplements. She has since stopped strictly due to not keeping up with taking them routinely.   Health Maintenance: Immunizations -- COVID- Never done.  Tdap- Last completed 04/01/17. Discontinued.  Colonoscopy -- Last completed 12/17/16. Mammogram -- Last completed 11/01/20. Dentistry- Sees one regularly . Ophthalmology- Sees one regularly.  PAP -- N/A due to hysterectomy  Bone Density -- N/A Diet -- Has not started weight management diet. Needs work.  Sleep habits -- Not sleeping regularly  Exercise -- Currently not getting much physical activity Weight -- Gained 6lbs since last visit Wt Readings from Last 3 Encounters:  07/18/21 152 lb (68.9 kg)  07/12/21 146 lb (66.2 kg)  04/05/21 145 lb (65.8 kg)  Body mass index is 30.7 kg/m.  Mood -- Stable and managing well.  Patient's last menstrual period was 01/23/2006 (exact date). Alcohol use:  reports current alcohol use of about 1.0 standard drink per week. Tobacco use: Never Tobacco Use: Low Risk    Smoking Tobacco Use: Never   Smokeless Tobacco Use: Never     Depression screen PHQ 2/9 07/18/2021  Decreased Interest 0  Down, Depressed, Hopeless 0  PHQ - 2 Score 0  Altered sleeping -  Tired, decreased energy -  Change in appetite -   Feeling bad or failure about yourself  -  Trouble concentrating -  Moving slowly or fidgety/restless -  Suicidal thoughts -  PHQ-9 Score -  Difficult doing work/chores -     Other providers/specialists: Patient Care Team: Inda Coke, [place] as PCP - General (Physician Assistant)    PMHx, SurgHx, SocialHx, Medications, and Allergies were reviewed in the Visit Navigator and updated as appropriate.   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    age 34- removed by sx   History of gastric bypass 2009   Hx of blood clots    Joint pain    Overweight (BMI 25.0-29.9)    Sleep apnea    does not use CPAP now had gastric bypass   Urine incontinence      Past Surgical History:  Procedure Laterality Date   ABDOMINAL HYSTERECTOMY  2011   BLADDER REPAIR     BREAST REDUCTION SURGERY     GASTRIC BYPASS     KNEE SURGERY     LIPOMA EXCISION Left 10/27/2019   Procedure: EXCISION OF LEFT THIGH LIPOMA;  Surgeon: Erroll Luna, MD;  Location: [facility];  Service: General;  Laterality: Left;  TONSILLECTOMY AND ADENOIDECTOMY Bilateral 1962   TUBAL LIGATION     TUMMY TUCK N/A    WISDOM TOOTH EXTRACTION       Family History  Problem Relation Age of Onset   Cancer Mother    Arthritis Mother    Heart disease Mother    Diabetes Mother    Breast cancer Mother        27s   Heart disease Father    Hypertension Father    Sleep apnea Father    Alcohol abuse Brother    Diabetes Brother    Alcohol abuse Maternal Grandmother    Other Daughter        Overdose   ALS Son    Breast cancer Maternal Aunt 35   Alcohol abuse Maternal Uncle    Colon cancer Neg Hx    Esophageal cancer Neg Hx    Stomach cancer Neg Hx    Rectal cancer Neg Hx     Social  History   Tobacco Use   Smoking status: Never   Smokeless tobacco: Never  Vaping Use   Vaping Use: Never used  Substance Use Topics   Alcohol use: Yes    Alcohol/week: 1.0 standard drink    Types: 1 Standard drinks or equivalent per week    Comment: OCCASSIONAL   Drug use: No    Review of Systems:   Review of Systems  Constitutional:  Negative for chills, fever, malaise/fatigue and weight loss.  HENT:  Negative for hearing loss, sinus pain and sore throat.   Respiratory:  Negative for cough and hemoptysis.   Cardiovascular:  Negative for chest pain, palpitations, leg swelling and PND.  Gastrointestinal:  Negative for abdominal pain, constipation, diarrhea, heartburn, nausea and vomiting.  Genitourinary:  Negative for dysuria, frequency and urgency.  Musculoskeletal:  Negative for back pain, myalgias and neck pain.  Skin:  Negative for itching and rash.  Neurological:  Negative for dizziness, tingling, seizures and headaches.  Endo/Heme/Allergies:  Negative for polydipsia.  Psychiatric/Behavioral:  Negative for depression. The patient is not nervous/anxious.     Objective:   BP 136/80 (BP Location: Left Arm, Patient Position: Sitting, Cuff Size: Normal)   Pulse 73   Temp 98.4 F (36.9 C) (Temporal)   Ht 4\' 11"  (1.499 m)   Wt 152 lb (68.9 kg)   LMP 01/23/2006 (Exact Date)   SpO2 97%   BMI 30.70 kg/m  Body mass index is 30.7 kg/m.   General Appearance:    Alert, cooperative, no distress, appears stated age  Head:    Normocephalic, without obvious abnormality, atraumatic  Eyes:    PERRL, conjunctiva/corneas clear, EOM's intact, fundi    benign, both eyes  Ears:    Normal TM's and external ear canals, both ears  Nose:   Nares normal, septum midline, mucosa normal, no drainage    or sinus tenderness  Throat:   Lips, mucosa, and tongue normal; teeth and gums normal  Neck:   Supple, symmetrical, trachea midline, no adenopathy;    thyroid:  no  enlargement/tenderness/nodules; no carotid   bruit or JVD  Back:     Symmetric, no curvature, ROM normal, no CVA tenderness  Lungs:     Clear to auscultation bilaterally, respirations unlabored  Chest Wall:    No tenderness or deformity   Heart:    Regular rate and rhythm, S1 and S2 normal, no murmur, rub or gallop  Breast Exam:    Deferred  Abdomen:     Soft,  non-tender, bowel sounds active all four quadrants,    no masses, no organomegaly  Genitalia:    Deferred  Extremities:   Extremities normal, atraumatic, no cyanosis or edema  Pulses:   2+ and symmetric all extremities  Skin:   Skin color, texture, turgor normal, no rashes or lesions  Lymph nodes:   Cervical, supraclavicular, and axillary nodes normal  Neurologic:   CNII-XII intact, normal strength, sensation and reflexes    throughout    Assessment/Plan:   Routine physical examination Today patient counseled on age appropriate routine health concerns for screening and prevention, each reviewed and up to date or declined. Immunizations reviewed and up to date or declined. Labs ordered and reviewed. Risk factors for depression reviewed and negative. Hearing function and visual acuity are intact. ADLs screened and addressed as needed. Functional ability and level of safety reviewed and appropriate. Education, counseling and referrals performed based on assessed risks today. Patient provided with a copy of personalized plan for preventive services.  History of gastric bypass Update blood work today and provide recommendations accordingly Suspect that we will need to restart bariatric vitamins  Impaired fasting glucose Update HgbA1c Mounjaro per MWM Encouraged regular diet and exercise as able  Class 1 obesity with serious comorbidity and body mass index (BMI) of 30.0 to 30.9 in adult, unspecified obesity type Mounjaro per MWM Encouraged regular diet and exercise as able   Patient Counseling: [x]    Nutrition: Stressed  importance of moderation in sodium/caffeine intake, saturated fat and cholesterol, caloric balance, sufficient intake of fresh fruits, vegetables, fiber, calcium, iron, and 1 mg of folate supplement per day (for females capable of pregnancy).  [x]    Stressed the importance of regular exercise.   [x]    Substance Abuse: Discussed cessation/primary prevention of tobacco, alcohol, or other drug use; driving or other dangerous activities under the influence; availability of treatment for abuse.   [x]    Injury prevention: Discussed safety belts, safety helmets, smoke detector, smoking near bedding or upholstery.   [x]    Sexuality: Discussed sexually transmitted diseases, partner selection, use of condoms, avoidance of unintended pregnancy  and contraceptive alternatives.  [x]    Dental health: Discussed importance of regular tooth brushing, flossing, and dental visits.  [x]    Health maintenance and immunizations reviewed. Please refer to Health maintenance section.   I,Havlyn C Ratchford,acting as a Education administrator for [employer], PA.,have documented all relevant documentation on the behalf of Inda Coke, PA,as directed by  Inda Coke, PA while in the presence of Inda Coke, [place].   I, Inda Coke, [place], have reviewed all documentation for this visit. The documentation on 07/18/21 for the exam, diagnosis, procedures, and orders are all accurate and complete.   Inda Coke, PA-C [place]

## 2021-07-19 ENCOUNTER — Ambulatory Visit (HOSPITAL_COMMUNITY)
Admission: RE | Admit: 2021-07-19 | Discharge: 2021-07-19 | Disposition: A | Discharge status: Home / Self Care | Payer: BLUE CROSS/BLUE SHIELD | Source: Ambulatory Visit | Attending: Gastroenterology | Admitting: Gastroenterology

## 2021-07-19 ENCOUNTER — Encounter: Payer: Self-pay | Admitting: Gastroenterology

## 2021-07-19 DIAGNOSIS — R112 Nausea with vomiting, unspecified: Secondary | ICD-10-CM | POA: Insufficient documentation

## 2021-07-19 LAB — LIPID PANEL
Cholesterol: 208 mg/dL — ABNORMAL HIGH (ref 0–200)
HDL: 84.1 mg/dL (ref >39.00)
LDL Cholesterol: 105 mg/dL — ABNORMAL HIGH (ref 0–99)
NonHDL: 123.9
Total CHOL/HDL Ratio: 2
Triglycerides: 95 mg/dL (ref 0.0–149.0)
VLDL: 19 mg/dL (ref 0.0–40.0)

## 2021-07-19 LAB — CBC WITH DIFFERENTIAL/PLATELET
Basophils Absolute: 0.1 10*3/uL (ref 0.0–0.1)
Basophils Relative: 0.9 % (ref 0.0–3.0)
Eosinophils Absolute: 0.1 10*3/uL (ref 0.0–0.7)
Eosinophils Relative: 1.1 % (ref 0.0–5.0)
HCT: 38.2 % (ref 36.0–46.0)
Hemoglobin: 12.7 g/dL (ref 12.0–15.0)
Lymphocytes Relative: 28.6 % (ref 12.0–46.0)
Lymphs Abs: 1.8 10*3/uL (ref 0.7–4.0)
MCHC: 33.3 g/dL (ref 30.0–36.0)
MCV: 86.5 fl (ref 78.0–100.0)
Monocytes Absolute: 0.5 10*3/uL (ref 0.1–1.0)
Monocytes Relative: 7.2 % (ref 3.0–12.0)
Neutro Abs: 4 10*3/uL (ref 1.4–7.7)
Neutrophils Relative %: 62.2 % (ref 43.0–77.0)
Platelets: 283 10*3/uL (ref 150.0–400.0)
RBC: 4.42 Mil/uL (ref 3.87–5.11)
RDW: 13.5 % (ref 11.5–15.5)
WBC: 6.5 10*3/uL (ref 4.0–10.5)

## 2021-07-19 LAB — TSH: TSH: 2.81 u[IU]/mL (ref 0.35–5.50)

## 2021-07-19 LAB — HEMOGLOBIN A1C: Hgb A1c MFr Bld: 5.9 % (ref 4.6–6.5)

## 2021-07-19 LAB — VITAMIN D 25 HYDROXY (VIT D DEFICIENCY, FRACTURES): VITD: 29.68 ng/mL — ABNORMAL LOW (ref 30.00–100.00)

## 2021-07-19 LAB — COMPREHENSIVE METABOLIC PANEL
ALT: 30 U/L (ref 0–35)
AST: 24 U/L (ref 0–37)
Albumin: 4.3 g/dL (ref 3.5–5.2)
Alkaline Phosphatase: 107 U/L (ref 39–117)
BUN: 24 mg/dL — ABNORMAL HIGH (ref 6–23)
CO2: 26 mEq/L (ref 19–32)
Calcium: 9.2 mg/dL (ref 8.4–10.5)
Chloride: 105 mEq/L (ref 96–112)
Creatinine, Ser: 0.67 mg/dL (ref 0.40–1.20)
GFR: 92.6 mL/min (ref >60.00)
Glucose, Bld: 83 mg/dL (ref 70–99)
Potassium: 4.5 mEq/L (ref 3.5–5.1)
Sodium: 140 mEq/L (ref 135–145)
Total Bilirubin: 0.4 mg/dL (ref 0.2–1.2)
Total Protein: 6.5 g/dL (ref 6.0–8.3)

## 2021-07-19 LAB — VITAMIN B12: Vitamin B-12: 161 pg/mL — ABNORMAL LOW (ref 211–911)

## 2021-07-19 MED ORDER — TECHNETIUM TC 99M MEBROFENIN IV KIT
5.3300 | PACK | Freq: Once | INTRAVENOUS | Status: AC | PRN
  Administered 2021-07-19: 5.33 via INTRAVENOUS

## 2021-07-19 MED ORDER — TECHNETIUM TC 99M SULFUR COLLOID: 2.0000 | Freq: Once | INTRAVENOUS | Status: DC | PRN

## 2021-07-21 LAB — IRON,TIBC AND FERRITIN PANEL
%SAT: 11 % (calc) — ABNORMAL LOW (ref 16–45)
Ferritin: 5 ng/mL — ABNORMAL LOW (ref 16–288)
Iron: 58 ug/dL (ref 45–160)
TIBC: 516 mcg/dL (calc) — ABNORMAL HIGH (ref 250–450)

## 2021-07-21 LAB — VITAMIN B1: Vitamin B1 (Thiamine): 14 nmol/L (ref 8–30)

## 2021-07-21 LAB — FOLATE RBC: RBC Folate: 545 ng/mL RBC (ref >280)

## 2021-08-01 ENCOUNTER — Encounter: Payer: Self-pay | Admitting: Gastroenterology

## 2021-08-01 ENCOUNTER — Ambulatory Visit: Payer: No Typology Code available for payment source | Admitting: Gastroenterology

## 2021-08-01 VITALS — BP 114/80 | HR 78 | Ht <= 58 in | Wt 149.6 lb

## 2021-08-01 DIAGNOSIS — Z9884 Bariatric surgery status: Secondary | ICD-10-CM

## 2021-08-01 DIAGNOSIS — K580 Irritable bowel syndrome with diarrhea: Secondary | ICD-10-CM

## 2021-08-01 DIAGNOSIS — R1084 Generalized abdominal pain: Secondary | ICD-10-CM | POA: Diagnosis not present

## 2021-08-01 DIAGNOSIS — R110 Nausea: Secondary | ICD-10-CM

## 2021-08-01 DIAGNOSIS — K6389 Other specified diseases of intestine: Secondary | ICD-10-CM

## 2021-08-01 MED ORDER — DICYCLOMINE HCL 10 MG PO CAPS: 10.0000 mg | ORAL_CAPSULE | Freq: Three times a day (TID) | ORAL | 0 refills | Status: DC

## 2021-08-01 MED ORDER — RIFAXIMIN 550 MG PO TABS: 550.0000 mg | ORAL_TABLET | Freq: Three times a day (TID) | ORAL | 0 refills | Status: DC

## 2021-08-01 NOTE — Progress Notes (Signed)
Shelly Reynolds    [id number]    07/07/1956  Primary Care Physician:Worley, [place], [place]  Referring Physician: Inda Coke, [address] [place],  [place] [address]   Chief complaint:  Nausea, abdominal pain  HPI:  65 year old very pleasant female here with complaints of abdominal pain and intermittent nausea She has pain in the lower abdomen which radiates to the back and also occasionally to the right side, is worse after a meal.  No association with bowel habits or activity. She has intermittent nausea worse when she has severe pain.  No vomiting. No melena or rectal bleeding. She has intermittent diarrhea with fecal urgency and occasional seepage.  Also complains of abdominal bloating and excess gas.  EGD 04/05/21 - LA Grade C reflux esophagitis with no bleeding. - Z-line regular, 34 cm from the incisors. - Gastric bypass with a normal-sized pouch and intact staple line. Gastrojejunal anastomosis characterized by healthy appearing mucosa. - Normal examined jejunum.  HIDA scan 07/20/21 Normal  CT abd & pelvis 10/12/20 No acute abnormality, post op changes, 71mm left renal calculus and mild colonic diverticulosis   Outpatient Encounter Medications as of 08/01/2021  Medication Sig   ibuprofen (ADVIL) 800 MG tablet Take 1 tablet (800 mg total) by mouth every 8 (eight) hours as needed.   tirzepatide [facility]) 2.5 MG/0.5ML Pen Inject 2.5 mg into the skin once a week.   tirzepatide [facility]) 5 MG/0.5ML Pen Inject 5 mg into the skin once a week.   No facility-administered encounter medications on file as of 08/01/2021.    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    age 31-  removed by sx   History of gastric bypass 2009   Hx of blood clots    Joint pain    Overweight (BMI 25.0-29.9)    Sleep apnea    does not use CPAP now had gastric bypass   Urine incontinence     Past Surgical History:  Procedure Laterality Date   ABDOMINAL HYSTERECTOMY  2011   BLADDER REPAIR     BREAST REDUCTION SURGERY     GASTRIC BYPASS     KNEE SURGERY     LIPOMA EXCISION Left 10/27/2019   Procedure: EXCISION OF LEFT THIGH LIPOMA;  Surgeon: Erroll Luna, MD;  Location: [facility];  Service: General;  Laterality: Left;   TONSILLECTOMY AND ADENOIDECTOMY Bilateral 1962   TUBAL LIGATION     TUMMY TUCK N/A    WISDOM TOOTH EXTRACTION      Family History  Problem Relation Age of Onset   Cancer Mother    Arthritis Mother    Heart disease Mother    Diabetes Mother    Breast cancer Mother        56s   Heart disease Father    Hypertension Father    Sleep apnea Father    Alcohol abuse Brother    Diabetes Brother    Alcohol abuse Maternal Grandmother    Breast cancer Maternal Grandmother    Other Daughter        Overdose  ALS Son    Breast cancer Maternal Aunt 39   Alcohol abuse Maternal Uncle    Colon cancer Neg Hx    Esophageal cancer Neg Hx    Stomach cancer Neg Hx    Rectal cancer Neg Hx     Social History   Socioeconomic History   Marital status: Married    Spouse name: Not on file   Number of children: 5   Years of education: Not on file   Highest education level: Not on file  Occupational History   Occupation: retired   Occupation: Scientist, water quality at school  Tobacco Use   Smoking status: Never   Smokeless tobacco: Never  Vaping Use   Vaping Use: Never used  Substance and Sexual Activity   Alcohol use: Yes    Alcohol/week: 1.0 standard drink    Types: 1 Standard drinks or equivalent per week    Comment: OCCASSIONAL   Drug use: No   Sexual activity: Yes    Comment: HYSTERECTOMY  Other Topics Concern   Not on file  Social History  Narrative   Works at [employer] in the [place] Strain: Not on file  Food Insecurity: Not on file  Transportation Needs: Not on file  Physical Activity: Not on file  Stress: Not on file  Social Connections: Not on file  Intimate Partner Violence: Not on file      Review of systems: All other review of systems negative except as mentioned in the HPI.   Physical Exam: Vitals:   08/01/21 1428  BP: 114/80  Pulse: 78  SpO2: 98%   Body mass index is 31.27 kg/m. 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 The patient agreed with the plan and demonstrated an understanding of the instructions.  Damaris Hippo , MD    CC: Inda Coke, [place]

## 2021-08-01 NOTE — Patient Instructions (Signed)
You have been scheduled for an Upper GI Series and Small Bowel Follow Thru at __________. Your appointment is on __________ at __________. Please arrive 15 minutes prior to your test for registration. Make certain not to have anything to eat or drink after midnight on the night before your test. If you need to reschedule, please contact radiology at [phone]. --------------------------------------------------------------------------------------------------------------- An upper GI series uses x rays to help diagnose problems of the upper GI tract, which includes the esophagus, stomach, and duodenum. The duodenum is the first part of the small intestine. An upper GI series is conducted by a radiology technologist or a radiologist--a doctor who specializes in x-ray imaging--at a hospital or outpatient center. While sitting or standing in front of an x-ray machine, the patient drinks barium liquid, which is often white and has a chalky consistency and taste. The barium liquid coats the lining of the upper GI tract and makes signs of disease show up more clearly on x rays. X-ray video, called fluoroscopy, is used to view the barium liquid moving through the esophagus, stomach, and duodenum. Additional x rays and fluoroscopy are performed while the patient lies on an x-ray table. To fully coat the upper GI tract with barium liquid, the technologist or radiologist may press on the abdomen or ask the patient to change position. Patients hold still in various positions, allowing the technologist or radiologist to take x rays of the upper GI tract at different angles. If a technologist conducts the upper GI series, a radiologist will later examine the images to look for problems.  This test typically takes about 1 hour to complete --------------------------------------------------------------------------------------------------------------------------------------------- The Small Bowel Follow Thru examination is  used to visualize the entire small bowel (intestines); specifically the connection between the small and large intestine. You will be positioned on a flat x-ray table and an image of your abdomen taken. Then the technologist will show the x-ray to the radiologist. The radiologist will instruct your technologist how much (1-2 cups) barium sulfate you will drink and when to begin taking the timed x-rays, usually 15-30 minutes after you begin drinking. Barium is a harmless substance that will highlight your small intestine by absorbing x-ray. The taste is chalky and it feels very heavy both in the cup and in your stomach.  After the first x-ray is taken and shown to the radiologist, he/she will determine when the next image is to be taken. This is repeated until the barium has reached the end of the small intestine and enters the beginning of the colon (cecum). At such time when the barium spills into the colon, you will be positioned on the x-ray table once again. The radiologist will use a fluoroscopic camera to take some detailed pictures of the connection between your small intestine and colon. The fluoroscope is an x-ray unit that works with a television/computer screen. The radiologist will apply pressure to your abdomen with his/her hand and a lead glove, a plastic paddle, or a paddle with an inflated rubber balloon on the end. This is to spread apart your loops of intestine so he/she can see all areas.   This test typically takes around 1 hour to complete.  Important   Drink plenty of water (8-10 cups/day) for a few days following the procedure to avoid constipation and blockage. The barium will make your stools white for a few days. -------------------------------------------------------------------------------------------------------------------------------------------- _   We have sent your demographic information and a prescription for Xifaxan to [facility]. This pharmacy  is able to  get medication approved through insurance and get you the lowest copay possible. If you have not heard from them within 1 week, please call our office at [phone] to let us know.   If you are age 6 or older, your body mass index should be between 23-30. Your Body mass index is 31.27 kg/m. If this is out of the aforementioned range listed, please consider follow up with your Primary Care Provider.  If you are age 71 or younger, your body mass index should be between 19-25. Your Body mass index is 31.27 kg/m. If this is out of the aformentioned range listed, please consider follow up with your Primary Care Provider.   __________________________________________________________  The [facility] providers would like to encourage you to use [facility] to communicate with providers for non-urgent requests or questions.  Due to [facility] hold times on the telephone, sending your provider a message by [facility] may be a faster and more efficient way to get a response.  Please allow 48 business hours for a response.  Please remember that this is for non-urgent requests.    Due to recent changes in healthcare laws, you may see the results of your imaging and laboratory studies on MyChart before your provider has had a chance to review them.  We understand that in some cases there may be results that are confusing or concerning to you. Not all laboratory results come back in the same time frame and the provider may be waiting for multiple results in order to interpret others.  Please give [place] 48 hours in order for your provider to thoroughly review all the results before contacting the office for clarification of your results.    I appreciate the  opportunity to care for you  Thank You   Harl Bowie , MD

## 2021-08-15 ENCOUNTER — Encounter: Payer: Self-pay | Admitting: Gastroenterology

## 2021-08-15 ENCOUNTER — Ambulatory Visit (INDEPENDENT_AMBULATORY_CARE_PROVIDER_SITE_OTHER): Payer: No Typology Code available for payment source | Admitting: Family Medicine

## 2021-08-22 ENCOUNTER — Ambulatory Visit (HOSPITAL_COMMUNITY)
Admission: RE | Admit: 2021-08-22 | Discharge: 2021-08-22 | Disposition: A | Discharge status: Home / Self Care | Payer: BLUE CROSS/BLUE SHIELD | Source: Ambulatory Visit | Attending: Gastroenterology | Admitting: Gastroenterology

## 2021-08-22 DIAGNOSIS — K580 Irritable bowel syndrome with diarrhea: Secondary | ICD-10-CM | POA: Diagnosis present

## 2021-08-22 DIAGNOSIS — K6389 Other specified diseases of intestine: Secondary | ICD-10-CM | POA: Diagnosis present

## 2021-08-22 DIAGNOSIS — R110 Nausea: Secondary | ICD-10-CM | POA: Insufficient documentation

## 2021-08-22 DIAGNOSIS — R1084 Generalized abdominal pain: Secondary | ICD-10-CM | POA: Insufficient documentation

## 2021-08-22 DIAGNOSIS — Z9884 Bariatric surgery status: Secondary | ICD-10-CM | POA: Insufficient documentation

## 2021-08-23 ENCOUNTER — Other Ambulatory Visit: Payer: Self-pay

## 2021-08-23 MED ORDER — SUCRALFATE 1 G PO TABS: 1.0000 g | ORAL_TABLET | Freq: Three times a day (TID) | ORAL | 0 refills | Status: DC

## 2021-09-05 ENCOUNTER — Other Ambulatory Visit (INDEPENDENT_AMBULATORY_CARE_PROVIDER_SITE_OTHER): Payer: Self-pay | Admitting: Family Medicine

## 2021-09-05 DIAGNOSIS — R7301 Impaired fasting glucose: Secondary | ICD-10-CM

## 2021-09-06 NOTE — Telephone Encounter (Signed)
See my chart message

## 2021-09-13 ENCOUNTER — Encounter (INDEPENDENT_AMBULATORY_CARE_PROVIDER_SITE_OTHER): Payer: Self-pay

## 2021-09-13 ENCOUNTER — Encounter (INDEPENDENT_AMBULATORY_CARE_PROVIDER_SITE_OTHER): Payer: Self-pay | Admitting: Family Medicine

## 2021-09-13 ENCOUNTER — Ambulatory Visit (INDEPENDENT_AMBULATORY_CARE_PROVIDER_SITE_OTHER): Payer: No Typology Code available for payment source | Admitting: Family Medicine

## 2021-09-13 ENCOUNTER — Other Ambulatory Visit: Payer: Self-pay

## 2021-09-13 VITALS — BP 112/72 | HR 66 | Temp 97.6°F | Ht <= 58 in | Wt 142.0 lb

## 2021-09-13 DIAGNOSIS — K580 Irritable bowel syndrome with diarrhea: Secondary | ICD-10-CM

## 2021-09-13 DIAGNOSIS — R7301 Impaired fasting glucose: Secondary | ICD-10-CM

## 2021-09-13 DIAGNOSIS — Z6830 Body mass index (BMI) 30.0-30.9, adult: Secondary | ICD-10-CM

## 2021-09-13 DIAGNOSIS — Z9884 Bariatric surgery status: Secondary | ICD-10-CM

## 2021-09-13 DIAGNOSIS — E669 Obesity, unspecified: Secondary | ICD-10-CM

## 2021-09-14 MED ORDER — TIRZEPATIDE 7.5 MG/0.5ML ~~LOC~~ SOAJ: 7.5000 mg | SUBCUTANEOUS | 0 refills | Status: DC

## 2021-09-14 NOTE — Progress Notes (Signed)
Chief Complaint:   OBESITY Shelly Reynolds is here to discuss her progress with her obesity treatment plan along with follow-up of her obesity related diagnoses. See Medical Weight Management Flowsheet for complete bioelectrical impedance results.  Today's visit was #: 2 Starting weight: 146 lbs Starting date: 07/12/2021 Weight change since last visit: 4 lbs Total lbs lost to date: 4 lbs Total weight loss percentage to date: -2.74%  Nutrition Plan: Practicing portion control and making smarter food choices, such as increasing vegetables and decreasing simple carbohydrates for 80% of the time. Activity: None. Anti-obesity medications: Mounjaro 5 mg subcutaneously weekly. Reported side effects: None.  Interim History: [name] says she is tolerating Mounjaro 5 mg without side effects.  She has had a moderate decrease in polyphagia.  May need higher dose.  Assessment/Plan:   1. Irritable bowel syndrome with diarrhea The cause is not completely understood, but is likely to be a combination of genetics, diet, stress, visceral hypersensitivity and the gut microbiome.  The available treatments aim to control symptoms even if unable to "cure" the condition.  While not physically harmful, IBS can have a significant impact on quality of life. We discussed diet precautions and red flags. We will help her to reschedule her recently canceled colonoscopy.   2. Impaired fasting glucose, with polyphagia Improving, but not optimized. Current treatment: Mounjaro 5 mg subcutaneously weekly.    Plan:  Increase Mounjaro to 7.5 mg subcutaneously weekly.  She will continue to focus on protein-rich, low simple carbohydrate foods. We reviewed the importance of hydration, regular exercise for stress reduction, and restorative sleep.  - Increase tirzepatide (MOUNJARO) 7.5 MG/0.5ML Pen; Inject 7.5 mg into the skin once a week.  Dispense: 4 mL; Refill: 0  3. History of gastric bypass [name] is at risk for  malnutrition due to her previous bariatric surgery.   Counseling You may need to eat 3 meals and 2 snacks, or 5 small meals each day in order to reach your protein and calorie goals.  Allow at least 15 minutes for each meal so that you can eat mindfully. Listen to your body so that you do not overeat. For most people, your sleeve or pouch will comfortably hold 4-6 ounces. Eat foods from all food groups. This includes fruits and vegetables, grains, dairy, and meat and other proteins. Include a protein-rich food at every meal and snack, and eat the protein food first.  You should be taking a Bariatric Multivitamin as well as calcium.   4. Obesity, with current BMI 29.7  Course: [name] is currently in the action stage of change. As such, her goal is to continue with weight loss efforts.   Nutrition goals: She has agreed to practicing portion control and making smarter food choices, such as increasing vegetables and decreasing simple carbohydrates.   Exercise goals: For substantial health benefits, adults should do at least 150 minutes (2 hours and 30 minutes) a week of moderate-intensity, or 75 minutes (1 hour and 15 minutes) a week of vigorous-intensity aerobic physical activity, or an equivalent combination of moderate- and vigorous-intensity aerobic activity. Aerobic activity should be performed in episodes of at least 10 minutes, and preferably, it should be spread throughout the week.  Behavioral modification strategies: increasing lean protein intake, decreasing simple carbohydrates, and increasing vegetables.  [name] has agreed to follow-up with our clinic in 2 months. She was informed of the importance of frequent follow-up visits to maximize her success with intensive lifestyle modifications for her multiple health conditions.  Objective:   Blood pressure 112/72, pulse 66, temperature 97.6 F (36.4 C), temperature source Oral, height 4\' 10"  (1.473 m), weight 142 lb (64.4 kg), last  menstrual period 01/23/2006, SpO2 99 %. Body mass index is 29.68 kg/m.  General: Cooperative, alert, well developed, in no acute distress. HEENT: Conjunctivae and lids unremarkable. Cardiovascular: Regular rhythm.  Lungs: Normal work of breathing. Neurologic: No focal deficits.   Lab Results  Component Value Date   CREATININE 0.67 07/18/2021   BUN 24 (H) 07/18/2021   NA 140 07/18/2021   K 4.5 07/18/2021   CL 105 07/18/2021   CO2 26 07/18/2021   Lab Results  Component Value Date   ALT 30 07/18/2021   AST 24 07/18/2021   ALKPHOS 107 07/18/2021   BILITOT 0.4 07/18/2021   Lab Results  Component Value Date   HGBA1C 5.9 07/18/2021   Lab Results  Component Value Date   TSH 2.81 07/18/2021   Lab Results  Component Value Date   CHOL 208 (H) 07/18/2021   HDL 84.10 07/18/2021   LDLCALC 105 (H) 07/18/2021   TRIG 95.0 07/18/2021   CHOLHDL 2 07/18/2021   Lab Results  Component Value Date   VD25OH 29.68 (L) 07/18/2021   VD25OH 18 12/27/2016   Lab Results  Component Value Date   WBC 6.5 07/18/2021   HGB 12.7 07/18/2021   HCT 38.2 07/18/2021   MCV 86.5 07/18/2021   PLT 283.0 07/18/2021   Lab Results  Component Value Date   IRON 58 07/18/2021   TIBC 516 (H) 07/18/2021   FERRITIN 5 (L) 07/18/2021   Attestation Statements:   Reviewed by clinician on day of visit: allergies, medications, problem list, medical history, surgical history, family history, social history, and previous encounter notes.  I, Water quality scientist, CMA, am acting as transcriptionist for Briscoe Deutscher, DO  I have reviewed the above documentation for accuracy and completeness, and I agree with the above. -  Briscoe Deutscher, DO, MS, FAAFP, DABOM - Family and Bariatric Medicine.

## 2021-09-19 ENCOUNTER — Telehealth: Payer: Self-pay | Admitting: Gastroenterology

## 2021-09-19 ENCOUNTER — Telehealth (INDEPENDENT_AMBULATORY_CARE_PROVIDER_SITE_OTHER): Payer: Self-pay

## 2021-09-19 DIAGNOSIS — R7301 Impaired fasting glucose: Secondary | ICD-10-CM

## 2021-09-19 NOTE — Telephone Encounter (Signed)
The patient has been told by her insurance the Gastric Emptying scan was not pre-authorized. The insurance is refusing to pay. Can you help with this? I see that it was authorized.

## 2021-09-19 NOTE — Telephone Encounter (Signed)
Patient called states pre auth for nuclear medication, spoke with billing and they said there is nothing they can do. Seeking advice. Please advise [phone]

## 2021-09-19 NOTE — Telephone Encounter (Signed)
Pt called in and stated that her pharmacy doesn't have Mounjaro. The pt is requesting something else sent into the pharmacy. Please would also like a call about the new med that will be sent it. Please advise

## 2021-09-20 MED ORDER — ONDANSETRON 4 MG PO TBDP: 4.0000 mg | ORAL_TABLET | Freq: Three times a day (TID) | ORAL | 0 refills | Status: DC | PRN

## 2021-09-20 MED ORDER — TIRZEPATIDE 10 MG/0.5ML ~~LOC~~ SOAJ: 10.0000 mg | SUBCUTANEOUS | 0 refills | Status: DC

## 2021-09-20 NOTE — Telephone Encounter (Signed)
Spoke with pt and she agreed to the 10 mg dose. RXs sent

## 2021-09-27 ENCOUNTER — Other Ambulatory Visit (INDEPENDENT_AMBULATORY_CARE_PROVIDER_SITE_OTHER): Payer: Self-pay | Admitting: Family Medicine

## 2021-09-27 DIAGNOSIS — R7301 Impaired fasting glucose: Secondary | ICD-10-CM

## 2021-09-27 NOTE — Telephone Encounter (Signed)
LOV w/Dr. Juleen China

## 2021-09-28 ENCOUNTER — Ambulatory Visit: Payer: No Typology Code available for payment source | Admitting: Gastroenterology

## 2021-09-28 ENCOUNTER — Telehealth (INDEPENDENT_AMBULATORY_CARE_PROVIDER_SITE_OTHER): Payer: Self-pay | Admitting: Family Medicine

## 2021-09-28 DIAGNOSIS — R7301 Impaired fasting glucose: Secondary | ICD-10-CM

## 2021-09-28 NOTE — Telephone Encounter (Signed)
Patient is not able to get [facility] and want to now if there is an alternative she can try. She said she tried sending [employer], but it was not working.

## 2021-10-03 MED ORDER — TIRZEPATIDE 10 MG/0.5ML ~~LOC~~ SOAJ
10.0000 mg | SUBCUTANEOUS | 0 refills | Status: DC
Start: 2021-10-03 — End: 2021-11-14

## 2021-10-03 NOTE — Addendum Note (Signed)
Addended by: Karren Cobble on: 10/03/2021 10:15 AM   Modules accepted: Orders

## 2021-10-03 NOTE — Telephone Encounter (Signed)
Called pharmacy and they now have the 10 mg in stock, RX resent.  Pt notified

## 2021-10-10 ENCOUNTER — Other Ambulatory Visit: Payer: Self-pay | Admitting: Physician Assistant

## 2021-10-10 DIAGNOSIS — Z1231 Encounter for screening mammogram for malignant neoplasm of breast: Secondary | ICD-10-CM

## 2021-11-03 ENCOUNTER — Ambulatory Visit
Admission: RE | Admit: 2021-11-03 | Discharge: 2021-11-03 | Disposition: A | Discharge status: Home / Self Care | Payer: No Typology Code available for payment source | Source: Ambulatory Visit | Attending: Physician Assistant | Admitting: Physician Assistant

## 2021-11-03 DIAGNOSIS — Z1231 Encounter for screening mammogram for malignant neoplasm of breast: Secondary | ICD-10-CM

## 2021-11-06 ENCOUNTER — Encounter: Payer: Self-pay | Admitting: Gastroenterology

## 2021-11-06 ENCOUNTER — Other Ambulatory Visit: Payer: Self-pay | Admitting: Physician Assistant

## 2021-11-06 DIAGNOSIS — R928 Other abnormal and inconclusive findings on diagnostic imaging of breast: Secondary | ICD-10-CM

## 2021-11-07 ENCOUNTER — Other Ambulatory Visit (INDEPENDENT_AMBULATORY_CARE_PROVIDER_SITE_OTHER): Payer: Self-pay | Admitting: Family Medicine

## 2021-11-07 DIAGNOSIS — R7301 Impaired fasting glucose: Secondary | ICD-10-CM

## 2021-11-07 NOTE — Telephone Encounter (Signed)
Shelly Reynolds, In November, looks like she had an UGI.  We don't have a referral for that.  Radiology orders automatically produce an amb referral.  [employer] checks everything behind [place].  They would have surely called or emailed, but I also don't see any referrals they have created in the patient's chart to put there notes either. She had a study done in October, but again, there are no notes from preservice.  I'm willing to help.  Maybe someone could look at individual claims? I can't view with the security I have.

## 2021-11-07 NOTE — Telephone Encounter (Signed)
Shelly Reynolds, ES, or [name]- can we please reach out to the patient and find out what she is missing.  I do not see any authorization documented in the referral.

## 2021-11-08 NOTE — Telephone Encounter (Signed)
Dr.Wallace °

## 2021-11-14 ENCOUNTER — Ambulatory Visit (INDEPENDENT_AMBULATORY_CARE_PROVIDER_SITE_OTHER): Payer: No Typology Code available for payment source | Admitting: Family Medicine

## 2021-11-14 ENCOUNTER — Telehealth (INDEPENDENT_AMBULATORY_CARE_PROVIDER_SITE_OTHER): Payer: Self-pay

## 2021-11-14 DIAGNOSIS — R7301 Impaired fasting glucose: Secondary | ICD-10-CM

## 2021-11-14 MED ORDER — TIRZEPATIDE 10 MG/0.5ML ~~LOC~~ SOAJ: 10.0000 mg | SUBCUTANEOUS | 0 refills | Status: DC

## 2021-11-14 NOTE — Telephone Encounter (Signed)
RX sent

## 2021-11-14 NOTE — Telephone Encounter (Signed)
Patient was rescheduled to 2/6 from today, 1/31.  She states she is out of Mounjaro 10mg .  Her next dose is due today.  Please send refill request to [facility] on [address].  Thank you

## 2021-11-16 ENCOUNTER — Telehealth (INDEPENDENT_AMBULATORY_CARE_PROVIDER_SITE_OTHER): Payer: Self-pay | Admitting: Family Medicine

## 2021-11-16 DIAGNOSIS — R7301 Impaired fasting glucose: Secondary | ICD-10-CM

## 2021-11-16 NOTE — Telephone Encounter (Signed)
Patient is not able to get [facility] and has some questions for Dr. Juleen China.

## 2021-11-20 ENCOUNTER — Ambulatory Visit (INDEPENDENT_AMBULATORY_CARE_PROVIDER_SITE_OTHER): Payer: No Typology Code available for payment source | Admitting: Family Medicine

## 2021-11-20 MED ORDER — TIRZEPATIDE 12.5 MG/0.5ML ~~LOC~~ SOAJ
12.5000 mg | SUBCUTANEOUS | 2 refills | Status: DC
Start: 2021-11-20 — End: 2021-12-05

## 2021-11-20 NOTE — Telephone Encounter (Signed)
Pt wanted 12.5, RX sent

## 2021-11-20 NOTE — Telephone Encounter (Signed)
Pharmacy has 7.5 and 12.5 in stock, would you like to change dose? Cant find a pharmacy with 10

## 2021-11-21 ENCOUNTER — Ambulatory Visit (INDEPENDENT_AMBULATORY_CARE_PROVIDER_SITE_OTHER): Payer: No Typology Code available for payment source | Admitting: Physician Assistant

## 2021-12-01 ENCOUNTER — Ambulatory Visit
Admission: RE | Admit: 2021-12-01 | Discharge: 2021-12-01 | Disposition: A | Discharge status: Home / Self Care | Payer: No Typology Code available for payment source | Source: Ambulatory Visit | Attending: Physician Assistant | Admitting: Physician Assistant

## 2021-12-01 ENCOUNTER — Other Ambulatory Visit: Payer: Self-pay

## 2021-12-01 ENCOUNTER — Other Ambulatory Visit: Payer: Self-pay | Admitting: Physician Assistant

## 2021-12-01 DIAGNOSIS — R928 Other abnormal and inconclusive findings on diagnostic imaging of breast: Secondary | ICD-10-CM

## 2021-12-05 ENCOUNTER — Other Ambulatory Visit (INDEPENDENT_AMBULATORY_CARE_PROVIDER_SITE_OTHER): Payer: Self-pay

## 2021-12-05 DIAGNOSIS — R7301 Impaired fasting glucose: Secondary | ICD-10-CM

## 2021-12-05 MED ORDER — TIRZEPATIDE 12.5 MG/0.5ML ~~LOC~~ SOAJ: 12.5000 mg | SUBCUTANEOUS | 2 refills | Status: DC

## 2021-12-07 ENCOUNTER — Other Ambulatory Visit: Payer: No Typology Code available for payment source

## 2021-12-11 ENCOUNTER — Other Ambulatory Visit: Payer: Self-pay | Admitting: Physician Assistant

## 2021-12-11 ENCOUNTER — Ambulatory Visit
Admission: RE | Admit: 2021-12-11 | Discharge: 2021-12-11 | Disposition: A | Discharge status: Home / Self Care | Payer: No Typology Code available for payment source | Source: Ambulatory Visit | Attending: Physician Assistant | Admitting: Physician Assistant

## 2021-12-11 ENCOUNTER — Encounter: Payer: Self-pay | Admitting: Physician Assistant

## 2021-12-11 ENCOUNTER — Other Ambulatory Visit (HOSPITAL_COMMUNITY)
Admission: RE | Admit: 2021-12-11 | Discharge: 2021-12-11 | Disposition: A | Discharge status: Home / Self Care | Payer: BLUE CROSS/BLUE SHIELD | Source: Ambulatory Visit | Attending: Diagnostic Radiology | Admitting: Diagnostic Radiology

## 2021-12-11 DIAGNOSIS — R928 Other abnormal and inconclusive findings on diagnostic imaging of breast: Secondary | ICD-10-CM

## 2021-12-13 LAB — SURGICAL PATHOLOGY

## 2022-01-02 ENCOUNTER — Other Ambulatory Visit: Payer: Self-pay

## 2022-01-02 ENCOUNTER — Encounter (INDEPENDENT_AMBULATORY_CARE_PROVIDER_SITE_OTHER): Payer: Self-pay | Admitting: Family Medicine

## 2022-01-02 ENCOUNTER — Ambulatory Visit (INDEPENDENT_AMBULATORY_CARE_PROVIDER_SITE_OTHER): Payer: No Typology Code available for payment source | Admitting: Family Medicine

## 2022-01-02 VITALS — BP 126/76 | HR 64 | Temp 97.6°F | Ht <= 58 in | Wt 127.0 lb

## 2022-01-02 DIAGNOSIS — R7301 Impaired fasting glucose: Secondary | ICD-10-CM | POA: Diagnosis not present

## 2022-01-02 DIAGNOSIS — R5383 Other fatigue: Secondary | ICD-10-CM | POA: Diagnosis not present

## 2022-01-02 DIAGNOSIS — Z9884 Bariatric surgery status: Secondary | ICD-10-CM

## 2022-01-02 DIAGNOSIS — E669 Obesity, unspecified: Secondary | ICD-10-CM | POA: Diagnosis not present

## 2022-01-02 DIAGNOSIS — Z6826 Body mass index (BMI) 26.0-26.9, adult: Secondary | ICD-10-CM | POA: Diagnosis not present

## 2022-01-02 MED ORDER — TIRZEPATIDE 15 MG/0.5ML ~~LOC~~ SOAJ: 15.0000 mg | SUBCUTANEOUS | 4 refills | Status: DC

## 2022-01-11 NOTE — Progress Notes (Signed)
Chief Complaint:   OBESITY Shelly Reynolds is here to discuss her progress with her obesity treatment plan along with follow-up of her obesity related diagnoses. See Medical Weight Management Flowsheet for complete bioelectrical impedance results.  Today's visit was #: 3 Starting weight: 146 lbs Starting date: 07/12/2021 Weight change since last visit: 15 lbs Total lbs lost to date: 19 lbs Total weight loss percentage to date: -13.01%  Nutrition Plan: Practicing portion control and making smarter food choices, such as increasing vegetables and decreasing simple carbohydrates for 100% of the time. Activity: Increased walking. Anti-obesity medications: Mounjaro 12.5 mg subcutaneously weekly. Reported side effects: None.  Interim History: Shelly Reynolds says she has a lot of fatigue.  She has not been taking her bariatric multivitamin.  She has history of bariatric surgery and nutrient deficiencies.  She has been eating a lot of protein, she says.  She says she still has some polyphagia.  Assessment/Plan:   1. Impaired fasting glucose Shelly Reynolds is taking Mounjaro 12.5 mg subcutaneously daily.  Plan:  Increase Mounjaro 15 mg subcutaneously weekly, as per below.  - Increase tirzepatide (MOUNJARO) 15 MG/0.5ML Pen; Inject 15 mg into the skin once a week.  Dispense: 6 mL; Refill: 4  2. Other fatigue She will continue to focus on protein-rich, low simple carbohydrate foods. We reviewed the importance of hydration, regular exercise for stress reduction, and restorative sleep. We will continue to follow as it relates to her weight loss journey.  3. History of gastric bypass Shelly Reynolds is at risk for malnutrition due to her previous bariatric surgery.   Counseling You may need to eat 3 meals and 2 snacks, or 5 small meals each day in order to reach your protein and calorie goals.  Allow at least 15 minutes for each meal so that you can eat mindfully. Listen to your body so that you do not overeat. For  most people, your sleeve or pouch will comfortably hold 4-6 ounces. Eat foods from all food groups. This includes fruits and vegetables, grains, dairy, and meat and other proteins. Include a protein-rich food at every meal and snack, and eat the protein food first.  You should be taking a Bariatric Multivitamin as well as calcium.    4. Obesity, with current BMI 26.5  Course: Shelly Reynolds is currently in the action stage of change. As such, her goal is to continue with weight loss efforts.   Nutrition goals: She has agreed to practicing portion control and making smarter food choices, such as increasing vegetables and decreasing simple carbohydrates.   Exercise goals:  As is.  Behavioral modification strategies: increasing lean protein intake, decreasing simple carbohydrates, increasing vegetables, and increasing water intake.  Shelly Reynolds has agreed to follow-up with our clinic in 6 weeks. She was informed of the importance of frequent follow-up visits to maximize her success with intensive lifestyle modifications for her multiple health conditions.   Objective:   Blood pressure 126/76, pulse 64, temperature 97.6 F (36.4 C), temperature source Oral, height '4\' 10"'$  (1.473 m), weight 127 lb (57.6 kg), last menstrual period 01/23/2006, SpO2 99 %. Body mass index is 26.54 kg/m.  General: Cooperative, alert, well developed, in no acute distress. HEENT: Conjunctivae and lids unremarkable. Cardiovascular: Regular rhythm.  Lungs: Normal work of breathing. Neurologic: No focal deficits.   Lab Results  Component Value Date   CREATININE 0.67 07/18/2021   BUN 24 (H) 07/18/2021   NA 140 07/18/2021   K 4.5 07/18/2021   CL 105 07/18/2021  CO2 26 07/18/2021   Lab Results  Component Value Date   ALT 30 07/18/2021   AST 24 07/18/2021   ALKPHOS 107 07/18/2021   BILITOT 0.4 07/18/2021   Lab Results  Component Value Date   HGBA1C 5.9 07/18/2021   Lab Results  Component Value Date   TSH 2.81  07/18/2021   Lab Results  Component Value Date   CHOL 208 (H) 07/18/2021   HDL 84.10 07/18/2021   LDLCALC 105 (H) 07/18/2021   TRIG 95.0 07/18/2021   CHOLHDL 2 07/18/2021   Lab Results  Component Value Date   VD25OH 29.68 (L) 07/18/2021   VD25OH 18 12/27/2016   Lab Results  Component Value Date   WBC 6.5 07/18/2021   HGB 12.7 07/18/2021   HCT 38.2 07/18/2021   MCV 86.5 07/18/2021   PLT 283.0 07/18/2021   Lab Results  Component Value Date   IRON 58 07/18/2021   TIBC 516 (H) 07/18/2021   FERRITIN 5 (L) 07/18/2021   Attestation Statements:   Reviewed by clinician on day of visit: allergies, medications, problem list, medical history, surgical history, family history, social history, and previous encounter notes.  I, Water quality scientist, CMA, am acting as transcriptionist for Briscoe Deutscher, DO  I have reviewed the above documentation for accuracy and completeness, and I agree with the above. -  Briscoe Deutscher, DO, MS, FAAFP, DABOM - Family and Bariatric Medicine.

## 2022-05-23 ENCOUNTER — Encounter (INDEPENDENT_AMBULATORY_CARE_PROVIDER_SITE_OTHER): Payer: Self-pay

## 2022-09-27 ENCOUNTER — Other Ambulatory Visit: Payer: Self-pay | Admitting: Physician Assistant

## 2022-09-27 DIAGNOSIS — Z1231 Encounter for screening mammogram for malignant neoplasm of breast: Secondary | ICD-10-CM

## 2022-10-10 ENCOUNTER — Encounter (HOSPITAL_BASED_OUTPATIENT_CLINIC_OR_DEPARTMENT_OTHER): Payer: Self-pay | Admitting: Emergency Medicine

## 2022-10-10 ENCOUNTER — Emergency Department (HOSPITAL_BASED_OUTPATIENT_CLINIC_OR_DEPARTMENT_OTHER): Payer: Medicare Other | Admitting: Radiology

## 2022-10-10 ENCOUNTER — Other Ambulatory Visit: Payer: Self-pay

## 2022-10-10 ENCOUNTER — Emergency Department (HOSPITAL_BASED_OUTPATIENT_CLINIC_OR_DEPARTMENT_OTHER)
Admission: EM | Admit: 2022-10-10 | Discharge: 2022-10-10 | Disposition: A | Discharge status: Home / Self Care | Payer: Medicare Other | Attending: Emergency Medicine | Admitting: Emergency Medicine

## 2022-10-10 DIAGNOSIS — U071 COVID-19: Secondary | ICD-10-CM | POA: Insufficient documentation

## 2022-10-10 DIAGNOSIS — H938X3 Other specified disorders of ear, bilateral: Secondary | ICD-10-CM | POA: Insufficient documentation

## 2022-10-10 DIAGNOSIS — R0981 Nasal congestion: Secondary | ICD-10-CM | POA: Diagnosis present

## 2022-10-10 DIAGNOSIS — Z8616 Personal history of COVID-19: Secondary | ICD-10-CM | POA: Insufficient documentation

## 2022-10-10 DIAGNOSIS — J3489 Other specified disorders of nose and nasal sinuses: Secondary | ICD-10-CM

## 2022-10-10 LAB — RESP PANEL BY RT-PCR (RSV, FLU A&B, COVID)  RVPGX2
Influenza A by PCR: NEGATIVE
Influenza B by PCR: NEGATIVE
Resp Syncytial Virus by PCR: NEGATIVE
SARS Coronavirus 2 by RT PCR: POSITIVE — AB

## 2022-10-10 MED ORDER — AMOXICILLIN 500 MG PO CAPS: 500.0000 mg | ORAL_CAPSULE | Freq: Three times a day (TID) | ORAL | 0 refills | Status: DC

## 2022-10-10 NOTE — ED Notes (Signed)
Pt sitting up to chairside upon this nurse entering room - Pt not wearing mask; pt provided mask by this nurse -- reinforced d/c instructions and discussed quarantine -- pt states "I don't even believe in [place]; it's just like a sinus infection" - educated that covid is a virus and is contagious unlike a sinus infection - pointed out again to quarantine for min 5 days; unless not fever free for 24 hours after 5 days -- reinforced to patient to review covid-19 info printed in packet.

## 2022-10-10 NOTE — ED Triage Notes (Signed)
Pt arrives to ED with c/o nasal congestion, sore throat, post nasal drip, and ear fullness/itching that started yesterday.

## 2022-10-10 NOTE — Discharge Instructions (Addendum)
You tested positive for COVID today. Please take your medications as prescribed. Take tylenol/ibuprofen for pain. I recommend close follow-up with PCP for reevaluation.  Please do not hesitate to return to emergency department if worrisome signs symptoms we discussed become apparent.

## 2022-10-10 NOTE — ED Provider Notes (Signed)
Shelly Reynolds   CSN: [id number] Arrival date & time: 10/10/22  1103     History  Chief Complaint  Patient presents with   Nasal Congestion   Ear Fullness    Shelly Reynolds is a 66 y.o. female with a past medical history of below presenting to the emergency department for evaluation of cold symptoms.  Patient reports she began to have nasal congestions, sore throat, postnasal drip, ear fullness that started this morning.  No fever.  She states that her husband at home got pneumonia and she might have got it from him.  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 57- removed by sx   History of gastric bypass 2009   Hx of blood clots    Joint pain    Overweight (BMI 25.0-29.9)    Sleep apnea    does not use CPAP now had gastric bypass   Urine incontinence    Past Surgical History:  Procedure Laterality Date   ABDOMINAL HYSTERECTOMY  2011   BLADDER REPAIR     BREAST REDUCTION SURGERY     GASTRIC BYPASS     KNEE SURGERY     LIPOMA EXCISION Left 10/27/2019   Procedure: EXCISION OF LEFT THIGH LIPOMA;  Surgeon: Erroll Luna, MD;  Location: [place];  Service: General;  Laterality: Left;   REDUCTION MAMMAPLASTY     TONSILLECTOMY AND ADENOIDECTOMY Bilateral 1962   TUBAL LIGATION     TUMMY TUCK N/A    WISDOM TOOTH EXTRACTION       Home Medications Prior to Admission medications   Medication Sig Start Date End Date Taking? Authorizing Provider  amoxicillin (AMOXIL) 500 MG capsule Take 1 capsule (500 mg total) by mouth 3 (three) times daily. 10/10/22  Yes Rex Kras, PA  ibuprofen (ADVIL) 800 MG tablet Take 1 tablet (800 mg total) by mouth every 8 (eight) hours as needed. 08/30/20    Inda Coke, PA  tirzepatide [facility]) 12.5 MG/0.5ML Pen Inject 12.5 mg into the skin once a week. 12/05/21   Briscoe Deutscher, DO  tirzepatide [facility]) 15 MG/0.5ML Pen Inject 15 mg into the skin once a week. 01/02/22   Briscoe Deutscher, DO      Allergies    Patient has no known allergies.    Review of Systems   Review of Systems Negative except as per HPI.  Physical Exam Updated Vital Signs BP 119/71   Pulse 99   Temp 99.1 F (37.3 C) (Oral)   Resp 17   Ht '4\' 11"'$  (1.499 m)   Wt 48.1 kg   LMP 01/23/2006 (Exact Date)   SpO2 98%   BMI 21.41 kg/m  Physical Exam Vitals and nursing Reynolds reviewed.  Constitutional:      Appearance: Normal appearance.  HENT:     Head: Normocephalic and atraumatic.     Mouth/Throat:     Mouth: Mucous membranes are moist.  Eyes:     General: No scleral icterus. Cardiovascular:     Rate and Rhythm: Normal rate and regular rhythm.     Pulses: Normal pulses.     Heart sounds: Normal  heart sounds.  Pulmonary:     Effort: Pulmonary effort is normal.     Breath sounds: Normal breath sounds.  Abdominal:     General: Abdomen is flat.     Palpations: Abdomen is soft.     Tenderness: There is no abdominal tenderness.  Musculoskeletal:        General: No deformity.  Skin:    General: Skin is warm.     Findings: No rash.  Neurological:     General: No focal deficit present.     Mental Status: She is alert.  Psychiatric:        Mood and Affect: Mood normal.     ED Results / Procedures / Treatments   Labs (all labs ordered are listed, but only abnormal results are displayed) Labs Reviewed  RESP PANEL BY RT-PCR (RSV, FLU A&B, COVID)  RVPGX2 - Abnormal; Notable for the following components:      Result Value   SARS Coronavirus 2 by RT PCR POSITIVE (*)    All other components within normal limits    EKG None  Radiology DG Chest Port 1 View  Result Date: 10/10/2022 CLINICAL DATA:  Dyspnea EXAM: PORTABLE CHEST 1 VIEW COMPARISON:   10/19/2019 FINDINGS: The heart size and mediastinal contours are within normal limits. Both lungs are clear. The visualized skeletal structures are unremarkable. IMPRESSION: No active disease. Electronically Signed   By: Fidela Salisbury M.D.   On: 10/10/2022 18:19    Procedures Procedures    Medications Ordered in ED Medications - No data to display  ED Course/ Medical Decision Making/ A&P                           Medical Decision Making Amount and/or Complexity of Data Reviewed Radiology: ordered.  Risk Prescription drug management.   This patient presents to the ED for sore throat, body aches, this involves an extensive number of treatment options, and is a complaint that carries with a high risk of complications and morbidity.  The differential diagnosis includes flu, COVID, RSV, strep, pharyngitis, bronchitis, pneumonia, infectious etiology.  This is not an exhaustive list.  Lab tests: I ordered and personally interpreted labs.  The pertinent results include: Viral panel positive for Covid.  Imaging studies: Chest x-ray negative.  Problem list/ ED course/ Critical interventions/ Medical management: HPI: See above Vital signs within normal range and stable throughout visit. Laboratory/imaging studies significant for: See above. On physical examination, patient is afebrile and appears in no acute distress. This patient presents with symptoms suspicious for Covid. Based on history and physical can not rule out sinusitis. COVID test was positive. Do not suspect underlying cardiopulmonary process. I considered, but think unlikely, pneumonia. Patient is nontoxic appearing and not in need of emergent medical intervention. Patient told to self isolate at home until symptoms subside for 72 hours.  Recommended patient to take TheraFlu or Mucinex for symptom relief.  I also prescribed a wait-and-see prescription of amoxicillin for sinusitis.  Follow-up with primary care physician for  further evaluation and management.  Return to the ER if new or worsening symptoms. I have reviewed the patient home medicines and have made adjustments as needed.  Cardiac monitoring/EKG: The patient was maintained on a cardiac monitor.  I personally reviewed and interpreted the cardiac monitor which showed an underlying rhythm of: sinus rhythm.  Additional history obtained: External records from outside source obtained and reviewed including: Chart review including previous notes, labs,  imaging.  Consultations obtained:  Disposition Continued outpatient therapy. Follow-up with PCP recommended for reevaluation of symptoms. Treatment plan discussed with patient.  Pt acknowledged understanding was agreeable to the plan. Worrisome signs and symptoms were discussed with patient, and patient acknowledged understanding to return to the ED if they noticed these signs and symptoms. Patient was stable upon discharge.   This chart was dictated using voice recognition software.  Despite best efforts to proofread,  errors can occur which can change the documentation meaning.          Final Clinical Impression(s) / ED Diagnoses Final diagnoses:  COVID  Sinus pain  Sensation of fullness in both ears    Rx / DC Orders ED Discharge Orders          Ordered    amoxicillin (AMOXIL) 500 MG capsule  3 times daily        10/10/22 1845              Rex Kras, PA 10/10/22 2330    Isla Pence, MD 10/17/22 [phone]

## 2022-10-12 ENCOUNTER — Encounter: Payer: Self-pay | Admitting: Physician Assistant

## 2022-10-12 ENCOUNTER — Telehealth (INDEPENDENT_AMBULATORY_CARE_PROVIDER_SITE_OTHER): Payer: Medicare Other | Admitting: Physician Assistant

## 2022-10-12 DIAGNOSIS — H6993 Unspecified Eustachian tube disorder, bilateral: Secondary | ICD-10-CM | POA: Diagnosis not present

## 2022-10-12 DIAGNOSIS — U071 COVID-19: Secondary | ICD-10-CM

## 2022-10-12 MED ORDER — PREDNISONE 20 MG PO TABS: 40.0000 mg | ORAL_TABLET | Freq: Every day | ORAL | 0 refills | Status: DC

## 2022-10-12 NOTE — Progress Notes (Signed)
   Virtual Visit via Video Note   I, Shelly Reynolds, connected with  ODESSIE POLZIN  ([id number], 1956-04-08) on 10/12/22 at  9:40 AM EST by a video-enabled telemedicine application and verified that I am speaking with the correct person using two identifiers.  Location: Patient: Home Provider: [place] office   I discussed the limitations of evaluation and management by telemedicine and the availability of in person appointments. The patient expressed understanding and agreed to proceed.    History of Present Illness: [name] is a 66 y.o. who identifies as a female who was assigned female at birth, and is being seen today for COVID.  She went to the ER on 10/10/2022 and was diagnosed with COVID.  Her symptoms started on 10/10/2022.  She had a negative chest x-ray.  She was not given any antiviral medication she was given a pocket prescription of amoxicillin per her insistence.  She has started this.  She is concerned because she still is having fluid and pressure in her ears.  She is also taking Mucinex and Flonase nasal spray.  She is also taking Tylenol.  History of gastric bypass -- cannot take NSAIDs.  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    Allergies: No Known Allergies Medications:  Current Outpatient Medications:    amoxicillin (AMOXIL) 500 MG capsule, Take 1 capsule (500 mg total) by mouth 3 (three) times daily., Disp: 21 capsule, Rfl: 0   ibuprofen (ADVIL) 800 MG tablet, Take 1 tablet (800 mg total) by mouth every 8 (eight) hours as needed., Disp: 30 tablet, Rfl: 1   phentermine (ADIPEX-P) 37.5 MG tablet, Take 37.5 mg by mouth every morning., Disp: , Rfl:    predniSONE (DELTASONE)  20 MG tablet, Take 2 tablets (40 mg total) by mouth daily., Disp: 10 tablet, Rfl: 0   tirzepatide (MOUNJARO) 12.5 MG/0.5ML Pen, Inject 12.5 mg into the skin once a week., Disp: 2 mL, Rfl: 2   tirzepatide (MOUNJARO) 15 MG/0.5ML Pen, Inject 15 mg into the skin once a week., Disp: 6 mL, Rfl: 4  Observations/Objective: Patient is well-developed, well-nourished in no acute distress.  Resting comfortably  at home.  Head is normocephalic, atraumatic.  No labored breathing.  Speech is clear and coherent with logical content.  Patient is alert and oriented at baseline.   Assessment and Plan: COVID-19; Dysfunction of both eustachian tubes No red flags on discussion She does not appear in any acute distress Discussed that symptom improvement may take some time Consider Sudafed decongestant for a few days and or Afrin nasal spray for maximum of 3 days to help with sinus and nasal congestion Follow-up if any worsening symptoms  Follow Up Instructions: I discussed the assessment and treatment plan with the patient. The patient was provided an opportunity to ask questions and all were answered. The patient agreed with the plan and demonstrated an understanding of the instructions.  A copy of instructions were sent to the patient via MyChart unless otherwise noted below.   The patient was advised to call back or seek an in-person evaluation if the symptoms worsen or if the condition fails to improve as anticipated.  Shelly Reynolds, [place]

## 2022-11-21 ENCOUNTER — Ambulatory Visit
Admission: RE | Admit: 2022-11-21 | Discharge: 2022-11-21 | Disposition: A | Discharge status: Home / Self Care | Payer: No Typology Code available for payment source | Source: Ambulatory Visit | Attending: Physician Assistant | Admitting: Physician Assistant

## 2022-11-21 DIAGNOSIS — Z1231 Encounter for screening mammogram for malignant neoplasm of breast: Secondary | ICD-10-CM

## 2023-01-30 ENCOUNTER — Telehealth: Payer: Self-pay | Admitting: Physician Assistant

## 2023-01-30 NOTE — Telephone Encounter (Signed)
Pt has an appt here on the 22nd to get referral to [employer]. She states her insurance does not need referral but the Audiology office does. She does not think she needs to come to appt. Please advise.  Fax#[phone]

## 2023-01-30 NOTE — Telephone Encounter (Signed)
Pt needs to keep her appointment has not been seen since 09/2022 when she had COVID and is overdue for physical.

## 2023-02-04 ENCOUNTER — Encounter: Payer: Self-pay | Admitting: Physician Assistant

## 2023-02-04 ENCOUNTER — Ambulatory Visit (INDEPENDENT_AMBULATORY_CARE_PROVIDER_SITE_OTHER): Payer: Medicare Other | Admitting: Physician Assistant

## 2023-02-04 VITALS — BP 130/88 | HR 71 | Temp 97.7°F | Ht 59.0 in | Wt 110.0 lb

## 2023-02-04 DIAGNOSIS — H9193 Unspecified hearing loss, bilateral: Secondary | ICD-10-CM

## 2023-02-04 NOTE — Progress Notes (Signed)
Shelly Reynolds is a 67 y.o. female here for a new problem.  History of Present Illness:   Chief Complaint  Patient presents with   Hearing Loss    Pt c/o hearing loss right > left x 1 year. She needs a referral to Audiology.    HPI  Bilateral hearing loss Has been going on for over a year. 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 53- removed by sx   History of gastric bypass 2009   Hx of blood clots    Joint pain    Overweight (BMI 25.0-29.9)    Sleep apnea    does not use CPAP now had gastric bypass   Urine incontinence      Social History   Tobacco Use   Smoking status: Never   Smokeless tobacco: Never  Vaping Use   Vaping Use: Never used  Substance Use Topics   Alcohol use: Yes    Alcohol/week: 1.0 standard drink of alcohol    Types: 1 Standard drinks or equivalent per week    Comment: OCCASSIONAL   Drug use: No    Past Surgical History:  Procedure Laterality Date   ABDOMINAL HYSTERECTOMY  2011   BLADDER REPAIR     BREAST REDUCTION SURGERY     GASTRIC BYPASS     KNEE SURGERY     LIPOMA EXCISION Left 10/27/2019   Procedure: EXCISION OF LEFT THIGH LIPOMA;  Surgeon: Harriette Bouillon, MD;  Location: [facility];  Service: General;  Laterality: Left;   REDUCTION MAMMAPLASTY     TONSILLECTOMY AND ADENOIDECTOMY Bilateral 1962   TUBAL LIGATION     TUMMY TUCK N/A    WISDOM TOOTH EXTRACTION      Family History  Problem Relation Age of Onset   Cancer Mother    Arthritis Mother    Heart disease Mother    Diabetes Mother    Breast cancer Mother        26s   Heart disease Father    Hypertension Father    Sleep apnea Father     Alcohol abuse Brother    Diabetes Brother    Alcohol abuse Maternal Grandmother    Breast cancer Maternal Grandmother    Other Daughter        Overdose   ALS Son    Breast cancer Maternal Aunt 35   Alcohol abuse Maternal Uncle    Colon cancer Neg Hx    Esophageal cancer Neg Hx    Stomach cancer Neg Hx    Rectal cancer Neg Hx     No Known Allergies  Current Medications:   Current Outpatient Medications:    ibuprofen (ADVIL) 800 MG tablet, Take 1 tablet (800 mg total) by mouth every 8 (eight) hours as needed., Disp: 30 tablet, Rfl: 1   phentermine (ADIPEX-P) 37.5 MG tablet, Take 37.5 mg by mouth every morning., Disp: , Rfl:    tirzepatide (MOUNJARO) 15 MG/0.5ML Pen, Inject 15 mg into the skin once a week., Disp: 6 mL, Rfl: 4   Review of Systems:   ROS Negative  unless otherwise specified per HPI.  Vitals:   Vitals:   02/04/23 1409  BP: 130/88  Pulse: 71  Temp: 97.7 F (36.5 C)  TempSrc: Temporal  SpO2: 99%  Weight: 110 lb (49.9 kg)  Height:  (1.499 m)     Body mass index is 22.22 kg/m.  Physical Exam:   Physical Exam Vitals and nursing note reviewed.  Constitutional:      General: She is not in acute distress.    Appearance: She is well-developed. She is not ill-appearing or toxic-appearing.  Cardiovascular:     Rate and Rhythm: Normal rate and regular rhythm.     Pulses: Normal pulses.     Heart sounds: Normal heart sounds, S1 normal and S2 normal.  Pulmonary:     Effort: Pulmonary effort is normal.     Breath sounds: Normal breath sounds.  Skin:    General: Skin is warm and dry.  Neurological:     Mental Status: She is alert.     GCS: GCS eye subscore is 4. GCS verbal subscore is 5. GCS motor subscore is 6.  Psychiatric:        Speech: Speech normal.        Behavior: Behavior normal. Behavior is cooperative.    Hearing Screening (Inadequate exam)  Method: Audiometry    Right ear  Left ear  Comments: Right ear: Failed Left ear: Failed     Assessment and Plan:   Decreased hearing of both ears Failed hearing evaluation Referral to [facility] per her request    Jarold Motto, PA-C

## 2023-02-04 NOTE — Patient Instructions (Addendum)
Referral has been placed for ENT  [facility] Audiology at [facility] [phone]  [address] [place], [place] [address]

## 2023-07-15 ENCOUNTER — Telehealth: Payer: Self-pay | Admitting: Physician Assistant

## 2023-07-15 NOTE — Telephone Encounter (Signed)
I have scheduled for Wed virtually.  Would we be able to work patient in sooner?

## 2023-07-15 NOTE — Telephone Encounter (Signed)
Pt will need an appt. We are sorry can not fill out FMLA with out seeing provider. Please call pt and explain she can do a virtual visit if she can not do in person.

## 2023-07-15 NOTE — Telephone Encounter (Signed)
Patient dropped off fmla paperwork to be filled out by provider. She would like them faxed to [facility] at [phone].

## 2023-07-15 NOTE — Telephone Encounter (Signed)
Called patient so schedule an appt to discuss frmla paperwork. Patient states she has never heard of it, does not understand what needs to ne discussed. She also states she needs to pay a copay just to discuss fmla.. Prior to hanging up she stated her husband just died and has a million things to do, she wants to know If provider can call her.

## 2023-07-15 NOTE — Telephone Encounter (Signed)
[name], we are completely booked tomorrow. Sorry

## 2023-07-15 NOTE — Telephone Encounter (Signed)
Pt will need an appointment to discuss FMLA. Lelon Mast has never filled out FMLA for her.

## 2023-07-17 ENCOUNTER — Encounter: Payer: Self-pay | Admitting: Physician Assistant

## 2023-07-17 ENCOUNTER — Telehealth (INDEPENDENT_AMBULATORY_CARE_PROVIDER_SITE_OTHER): Payer: Medicare Other | Admitting: Physician Assistant

## 2023-07-17 VITALS — Ht 59.0 in | Wt 108.0 lb

## 2023-07-17 DIAGNOSIS — F4321 Adjustment disorder with depressed mood: Secondary | ICD-10-CM | POA: Diagnosis not present

## 2023-07-17 DIAGNOSIS — Z0289 Encounter for other administrative examinations: Secondary | ICD-10-CM | POA: Diagnosis not present

## 2023-07-17 DIAGNOSIS — F4323 Adjustment disorder with mixed anxiety and depressed mood: Secondary | ICD-10-CM | POA: Diagnosis not present

## 2023-07-17 NOTE — Progress Notes (Signed)
   I acted as a Neurosurgeon for [employer], PA-C Corky Mull, LPN  Virtual Visit via Video Note   I, Jarold Motto, PA, connected with  Shelly Reynolds  ([id number], 05-08-1956) on 07/17/23 at 10:20 AM EDT by a video-enabled telemedicine application and verified that I am speaking with the correct person using two identifiers.  Location: Patient: Home Provider: [place] office   I discussed the limitations of evaluation and management by telemedicine and the availability of in person appointments. The patient expressed understanding and agreed to proceed.    History of Present Illness: Shelly Reynolds is a 67 y.o. who identifies as a female who was assigned female at birth, and is being seen today for FMLA. Pt recently lost her husband and is needing FMLA to be completed. She has been out of work since 07/13/2023 and her husband passed away on 29-Jul-2023. She has been unable to go to work due to extensive planning, coordinating and legal affairs related to his death.  She is greiving appropriately. Denies SI/HI or any intervention at this time.  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  Resting comfortably at home.  Head is normocephalic, atraumatic.  No labored breathing.  Speech is clear and  coherent with logical content.  Patient is alert and oriented at baseline.   Assessment and Plan: Grieving; Situational mixed anxiety and depressive disorder; Encounter for completion of form with patient No red flags Declines interventions for medication/talk therapy Denies SI/HI Family Medical Leave Act [employer]) completed for patient for continuous leave from 07/13/23 and tentative return to work date of 08/27/23 Follow-up in 3-6 months if needed, sooner if concerns  Follow Up Instructions: I discussed the assessment and treatment plan with the patient. The patient was provided an opportunity to ask questions and all were answered. The patient agreed with the plan and demonstrated an understanding of the instructions.  A copy of instructions were sent to the patient via MyChart unless otherwise noted below.   The patient was advised to call back or seek an in-person evaluation if the symptoms worsen or if the condition fails to improve as anticipated.  Jarold Motto, [place]

## 2023-08-20 ENCOUNTER — Telehealth: Payer: Self-pay | Admitting: Physician Assistant

## 2023-08-20 NOTE — Telephone Encounter (Signed)
Patient called requesting PCP write a letter stating she can come back to work on 08/27/23.

## 2023-08-21 ENCOUNTER — Encounter: Payer: Self-pay | Admitting: Physician Assistant

## 2023-08-21 NOTE — Telephone Encounter (Signed)
Spoke to pt told her return to work note is in her My Chart, you can access it through there. Pt verbalized understanding. If you need anything else please let me know. Pt verbalized understanding.

## 2023-09-04 ENCOUNTER — Ambulatory Visit: Payer: Medicare Other

## 2023-09-05 ENCOUNTER — Ambulatory Visit: Payer: Medicare Other

## 2023-09-05 VITALS — Wt 108.0 lb

## 2023-09-05 DIAGNOSIS — Z0000 Encounter for general adult medical examination without abnormal findings: Secondary | ICD-10-CM | POA: Diagnosis not present

## 2023-09-05 NOTE — Patient Instructions (Signed)
Shelly Reynolds , Thank you for taking time to come for your Medicare Wellness Visit. I appreciate your ongoing commitment to your health goals. Please review the following plan we discussed and let me know if I can assist you in the future.   Referrals/Orders/Follow-Ups/Clinician Recommendations: maintain health and activity    This is a list of the screening recommended for you and due dates:  Health Maintenance  Topic Date Due   Medicare Annual Wellness Visit  Never done   Flu Shot  01/13/2024*   Pneumonia Vaccine (1 of 1 - PCV) 02/04/2024*   Mammogram  11/22/2023   Colon Cancer Screening  12/18/2026   DTaP/Tdap/Td vaccine (2 - Td or Tdap) 04/02/2027   DEXA scan (bone density measurement)  Completed   HPV Vaccine  Aged Out   COVID-19 Vaccine  Discontinued   Hepatitis C Screening  Discontinued   Zoster (Shingles) Vaccine  Discontinued  *Topic was postponed. The date shown is not the original due date.    Advanced directives: (Declined) Advance directive discussed with you today. Even though you declined this today, please call our office should you change your mind, and we can give you the proper paperwork for you to fill out.  Next Medicare Annual Wellness Visit scheduled for next year: Yes

## 2023-09-05 NOTE — Progress Notes (Signed)
Subjective:   Shelly Reynolds is a 67 y.o. female who presents for an Initial Medicare Annual Wellness Visit.  Visit Complete: Virtual I connected with  [name] on 09/05/23 by a audio enabled telemedicine application and verified that I am speaking with the correct person using two identifiers.  Patient Location: Home  Provider Location: Office/Clinic  I discussed the limitations of evaluation and management by telemedicine. The patient expressed understanding and agreed to proceed.  Vital Signs: Because this visit was a virtual/telehealth visit, some criteria may be missing or patient reported. Any vitals not documented were not able to be obtained and vitals that have been documented are patient reported.  Cardiac Risk Factors include: advanced age (>40men, >90 women)     Objective:    Today's Vitals   09/05/23 1518  Weight: 108 lb (49 kg)   Body mass index is 21.81 kg/m.     09/05/2023    3:22 PM 10/10/2022   12:09 PM 10/27/2019    6:54 AM 10/21/2019   12:55 PM 10/17/2019   10:41 AM  Advanced Directives  Does Patient Have a Medical Advance Directive? No Yes No No No  Would patient like information on creating a medical advance directive? No - Patient declined  Yes (ED - Information included in AVS) Yes (MAU/Ambulatory/Procedural Areas - Information given) No - Patient declined    Current Medications (verified) Outpatient Encounter Medications as of 09/05/2023  Medication Sig   ibuprofen (ADVIL) 800 MG tablet Take 1 tablet (800 mg total) by mouth every 8 (eight) hours as needed.   phentermine (ADIPEX-P) 37.5 MG tablet Take 37.5 mg by mouth every morning.   WEGOVY 2.4 MG/0.75ML SOAJ Inject 2.4 mg into the skin once a week.   No facility-administered encounter medications on file as of 09/05/2023.    Allergies (verified) Patient has no known allergies.   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    age 60- removed by sx   History of gastric bypass 2009   Hx of blood clots    Joint pain    Overweight (BMI 25.0-29.9)    Sleep apnea    does not use CPAP now had gastric bypass   Urine incontinence    Past Surgical History:  Procedure Laterality Date   ABDOMINAL HYSTERECTOMY  2011   BLADDER REPAIR     BREAST REDUCTION SURGERY     GASTRIC BYPASS     KNEE SURGERY     LIPOMA EXCISION Left 10/27/2019   Procedure: EXCISION OF LEFT THIGH LIPOMA;  Surgeon: Harriette Bouillon, MD;  Location: [facility];  Service: General;  Laterality: Left;   REDUCTION MAMMAPLASTY     TONSILLECTOMY AND ADENOIDECTOMY Bilateral 1962   TUBAL LIGATION     TUMMY TUCK N/A    WISDOM TOOTH EXTRACTION     Family History  Problem Relation Age of Onset   Cancer Mother    Arthritis Mother    Heart disease Mother    Diabetes Mother    Breast cancer Mother        62s   Heart disease Father    Hypertension Father    Sleep apnea Father  Alcohol abuse Brother    Diabetes Brother    Alcohol abuse Maternal Grandmother    Breast cancer Maternal Grandmother    Other Daughter        Overdose   ALS Son    Breast cancer Maternal Aunt 35   Alcohol abuse Maternal Uncle    Colon cancer Neg Hx    Esophageal cancer Neg Hx    Stomach cancer Neg Hx    Rectal cancer Neg Hx    Social History   Socioeconomic History   Marital status: Widowed    Spouse name: Not on file   Number of children: 5   Years of education: Not on file   Highest education level: Not on file  Occupational History   Occupation: retired   Occupation: Conservation officer, nature at school  Tobacco Use   Smoking status: Never   Smokeless tobacco: Never  Vaping Use   Vaping status: Never Used  Substance and Sexual Activity   Alcohol use: Yes    Alcohol/week: 1.0 standard drink  of alcohol    Types: 1 Standard drinks or equivalent per week    Comment: OCCASSIONAL   Drug use: No   Sexual activity: Yes    Comment: HYSTERECTOMY  Other Topics Concern   Not on file  Social History Narrative   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   Husband passed away from lung cancer Sep 29, 2023   Has disabled son   Social Determinants of Health   Financial Resource Strain: Not on file  Food Insecurity: Not on file  Transportation Needs: Not on file  Physical Activity: Sufficiently Active (09/05/2023)   Exercise Vital Sign    Days of Exercise per Week: 5 days    Minutes of Exercise per Session: 60 min  Stress: No Stress Concern Present (09/05/2023)   [employer] of Occupational Health - Occupational Stress Questionnaire    Feeling of Stress : Not at all  Social Connections: Moderately Isolated (09/05/2023)   Social Connection and Isolation Panel [NHANES]    Frequency of Communication with Friends and Family: More than three times a week    Frequency of Social Gatherings with Friends and Family: More than three times a week    Attends Religious Services: 1 to 4 times per year    Active Member of [employer] or Organizations: No    Attends Banker Meetings: Never    Marital Status: Widowed    Tobacco Counseling Counseling given: Not Answered   Clinical Intake:  Pre-visit preparation completed: Yes  Pain : No/denies pain     BMI - recorded: 21.81 Nutritional Status: BMI of 19-24  Normal Nutritional Risks: None Diabetes: No  How often do you need to have someone help you when you read instructions, pamphlets, or other written materials from your doctor or pharmacy?: 1 - Never  Interpreter Needed?: No  Information entered by :: Lanier Ensign, LPN   Activities of Daily Living    09/05/2023    3:19 PM  In your present state of health, do you have any difficulty performing the following activities:  Hearing? 1  Comment HOH  Vision? 0   Difficulty concentrating or making decisions? 0  Walking or climbing stairs? 0  Dressing or bathing? 0  Doing errands, shopping? 0  Preparing Food and eating ? N  Using the Toilet? N  In the past six months, have you accidently leaked urine? N  Do you have problems with loss of bowel control? N  Managing  your Medications? N  Managing your Finances? N  Housekeeping or managing your Housekeeping? N    Patient Care Team: Jarold Motto, [place] as PCP - General (Physician Assistant)  Indicate any recent Medical Services you may have received from other than [facility] providers in the past year (date may be approximate).     Assessment:   This is a routine wellness examination for [facility].  Hearing/Vision screen Hearing Screening - Comments:: HOH Vision Screening - Comments:: [facility]    Goals Addressed             This Visit's Progress    Patient Stated       Maintain health and activity        Depression Screen    09/05/2023    3:20 PM 02/04/2023    2:08 PM 10/12/2022    9:11 AM 07/18/2021    2:27 PM 07/13/2021    9:45 AM 08/05/2017    3:35 PM  PHQ 2/9 Scores  PHQ - 2 Score 0 0 0 0 2 0  PHQ- 9 Score     4     Fall Risk    09/05/2023    3:23 PM 02/04/2023    2:08 PM 01/21/2019    3:22 PM 08/05/2017    3:35 PM  Fall Risk   Falls in the past year? 0 0 0 No  Number falls in past yr: 0 0 0   Injury with Fall? 0 0 0   Risk for fall due to : No Fall Risks     Follow up Falls prevention discussed Falls evaluation completed      MEDICARE RISK AT HOME: Medicare Risk at Home Any stairs in or around the home?: Yes If so, are there any without handrails?: No Home free of loose throw rugs in walkways, pet beds, electrical cords, etc?: Yes Adequate lighting in your home to reduce risk of falls?: Yes Life alert?: No Use of a cane, walker or w/c?: No Grab bars in the bathroom?: No Shower chair or bench in shower?: No Elevated  toilet seat or a handicapped toilet?: No  TIMED UP AND GO:  Was the test performed? No    Cognitive Function:        09/05/2023    3:24 PM  6CIT Screen  What Year? 0 points  What month? 0 points  What time? 0 points  Count back from 20 0 points  Months in reverse 0 points  Repeat phrase 0 points  Total Score 0 points    Immunizations Immunization History  Administered Date(s) Administered   PPD Test 04/01/2017   Tdap 04/01/2017    TDAP status: Up to date  Flu Vaccine status: Declined, Education has been provided regarding the importance of this vaccine but patient still declined. Advised may receive this vaccine at local pharmacy or Health Dept. Aware to provide a copy of the vaccination record if obtained from local pharmacy or Health Dept. Verbalized acceptance and understanding.  Pneumococcal vaccine status: Declined,  Education has been provided regarding the importance of this vaccine but patient still declined. Advised may receive this vaccine at local pharmacy or Health Dept. Aware to provide a copy of the vaccination record if obtained from local pharmacy or Health Dept. Verbalized acceptance and understanding.   Covid-19 vaccine status: Declined, Education has been provided regarding the importance of this vaccine but patient still declined. Advised may receive this vaccine at local pharmacy or  Health Dept.or vaccine clinic. Aware to provide a copy of the vaccination record if obtained from local pharmacy or Health Dept. Verbalized acceptance and understanding.  Qualifies for Shingles Vaccine? Yes   Zostavax completed No   Shingrix Completed?: No.    Education has been provided regarding the importance of this vaccine. Patient has been advised to call insurance company to determine out of pocket expense if they have not yet received this vaccine. Advised may also receive vaccine at local pharmacy or Health Dept. Verbalized acceptance and understanding.  Screening  Tests Health Maintenance  Topic Date Due   INFLUENZA VACCINE  01/13/2024 (Originally 05/16/2023)   Pneumonia Vaccine 2+ Years old (1 of 1 - PCV) 02/04/2024 (Originally 06/28/2022)   MAMMOGRAM  11/22/2023   Medicare Annual Wellness (AWV)  09/04/2024   Colonoscopy  12/18/2026   DTaP/Tdap/Td (2 - Td or Tdap) 04/02/2027   DEXA SCAN  Completed   HPV VACCINES  Aged Out   COVID-19 Vaccine  Discontinued   Hepatitis C Screening  Discontinued   Zoster Vaccines- Shingrix  Discontinued    Health Maintenance  There are no preventive care reminders to display for this patient.   Colorectal cancer screening: Type of screening: Colonoscopy. Completed 12/17/16. Repeat every 10 years  Mammogram status: Completed 11/21/22. Repeat every year  Bone Density status: Completed 08/09/15. Results reflect: Bone density results: OSTEOPENIA. Repeat every 2 years.  Additional Screening:  Hepatitis C Screening: does not qualify discontinued   Vision Screening: Recommended annual ophthalmology exams for early detection of glaucoma and other disorders of the eye. Is the patient up to date with their annual eye exam?  Yes  Who is the provider or what is the name of the office in which the patient attends annual eye exams? [facility]  If pt is not established with a provider, would they like to be referred to a provider to establish care? No .   Dental Screening: Recommended annual dental exams for proper oral hygiene   Community Resource Referral / Chronic Care Management: CRR required this visit?  No   CCM required this visit?  No     Plan:     I have personally reviewed and noted the following in the patient's chart:   Medical and social history Use of alcohol, tobacco or illicit drugs  Current medications and supplements including opioid prescriptions. Patient is not currently taking opioid prescriptions. Functional ability and status Nutritional status Physical activity Advanced  directives List of other physicians Hospitalizations, surgeries, and ER visits in previous 12 months Vitals Screenings to include cognitive, depression, and falls Referrals and appointments  In addition, I have reviewed and discussed with patient certain preventive protocols, quality metrics, and best practice recommendations. A written personalized care plan for preventive services as well as general preventive health recommendations were provided to patient.     Marzella Schlein, LPN   16/07/[id number]   After Visit Summary: (MyChart) Due to this being a telephonic visit, the after visit summary with patients personalized plan was offered to patient via MyChart   Nurse Notes: none

## 2023-10-28 ENCOUNTER — Other Ambulatory Visit: Payer: Self-pay | Admitting: Physician Assistant

## 2023-10-28 DIAGNOSIS — Z1231 Encounter for screening mammogram for malignant neoplasm of breast: Secondary | ICD-10-CM

## 2023-11-26 ENCOUNTER — Ambulatory Visit
Admission: RE | Admit: 2023-11-26 | Discharge: 2023-11-26 | Disposition: A | Discharge status: Home / Self Care | Payer: Medicare Other | Source: Ambulatory Visit | Attending: Physician Assistant

## 2023-11-26 DIAGNOSIS — Z1231 Encounter for screening mammogram for malignant neoplasm of breast: Secondary | ICD-10-CM

## 2023-11-27 ENCOUNTER — Ambulatory Visit: Payer: Medicare Other

## 2023-12-26 IMAGING — MG MM DIGITAL SCREENING BILAT W/ TOMO AND CAD
8 series · 8 of 24 positions shown · non-contrast
Comparison: Previous exam(s).

CLINICAL DATA: Screening.

EXAM:
DIGITAL SCREENING BILATERAL MAMMOGRAM WITH TOMOSYNTHESIS AND CAD
TECHNIQUE: Bilateral screening digital craniocaudal and mediolateral oblique
mammograms were obtained. Bilateral screening digital breast
tomosynthesis was performed. The images were evaluated with
computer-aided detection.

[R CC synth-2D]
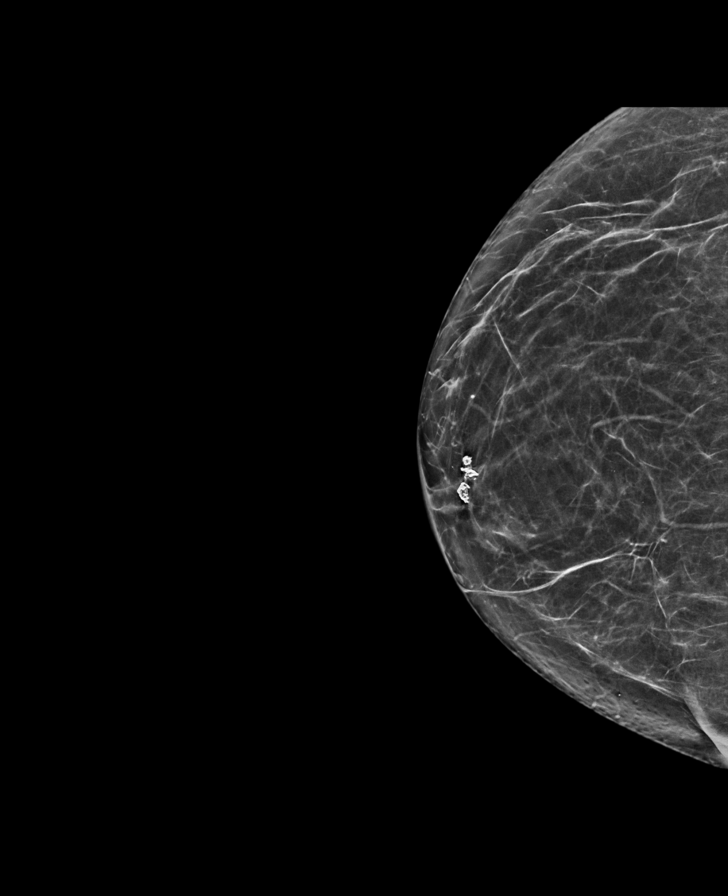

[R MLO synth-2D]
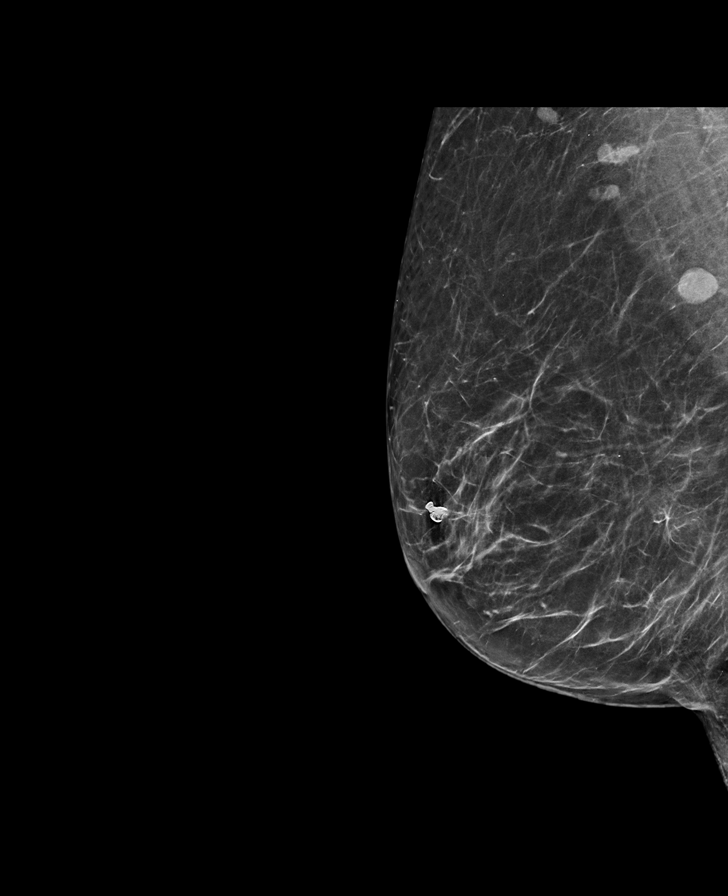

[L MLO synth-2D]
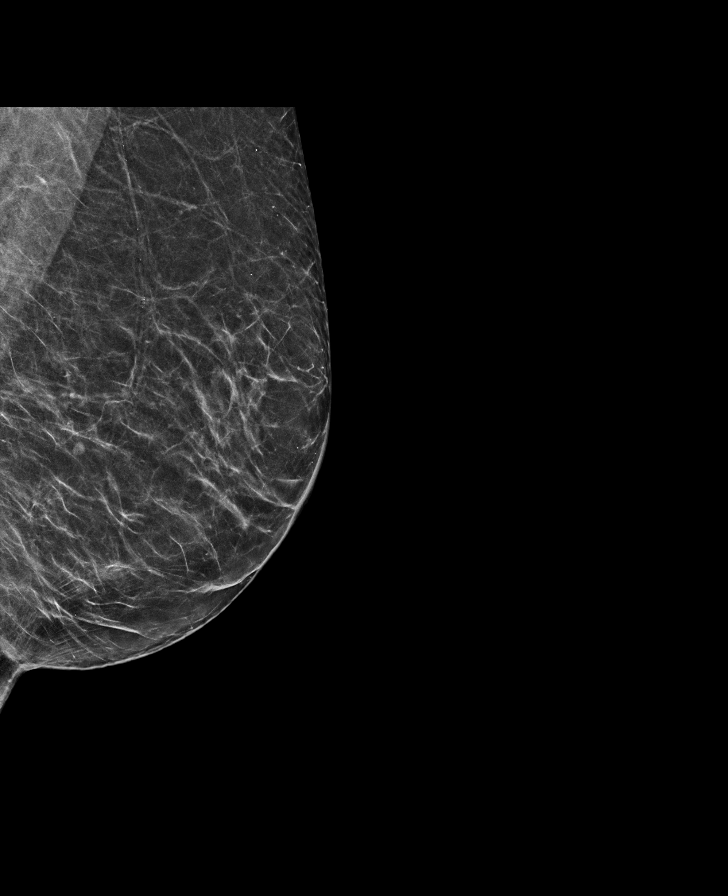

[L CC synth-2D]
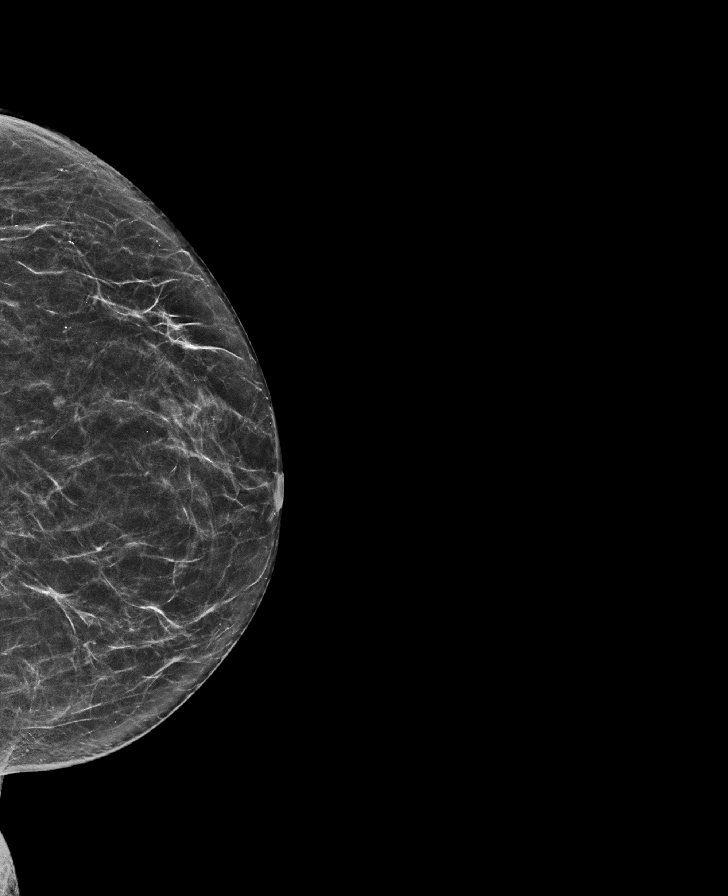

[L MLO tomo · tomo slice 32/63.0]
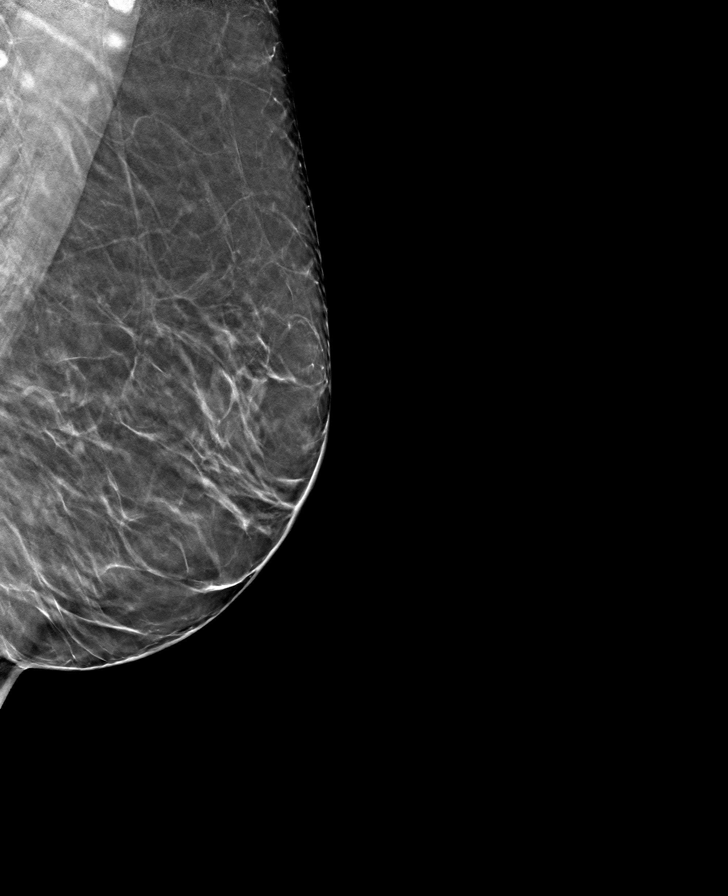

[L CC tomo · tomo slice 31/62.0]
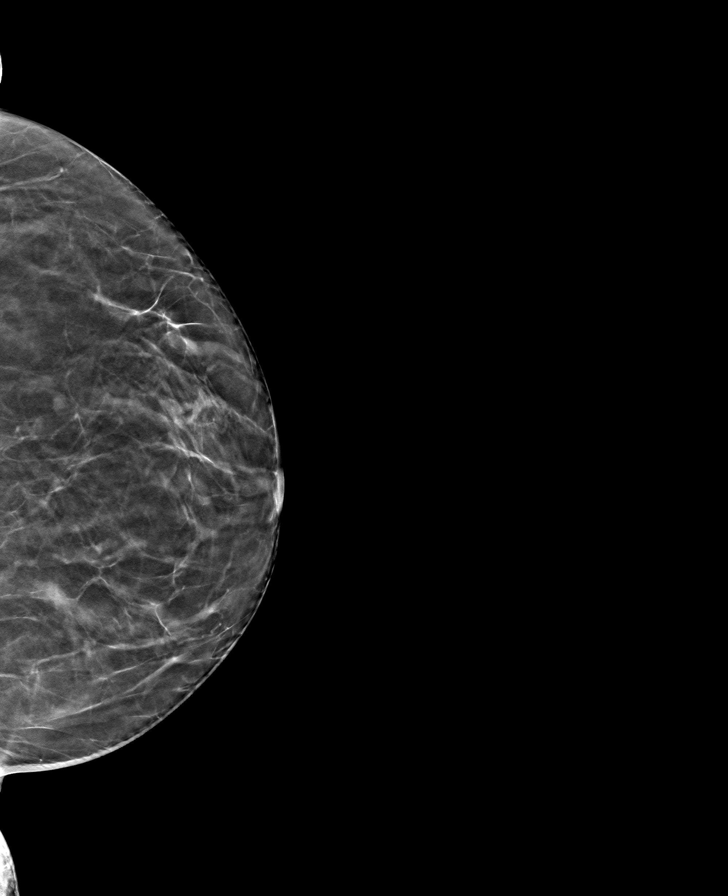

[R CC tomo · tomo slice 33/65.0]
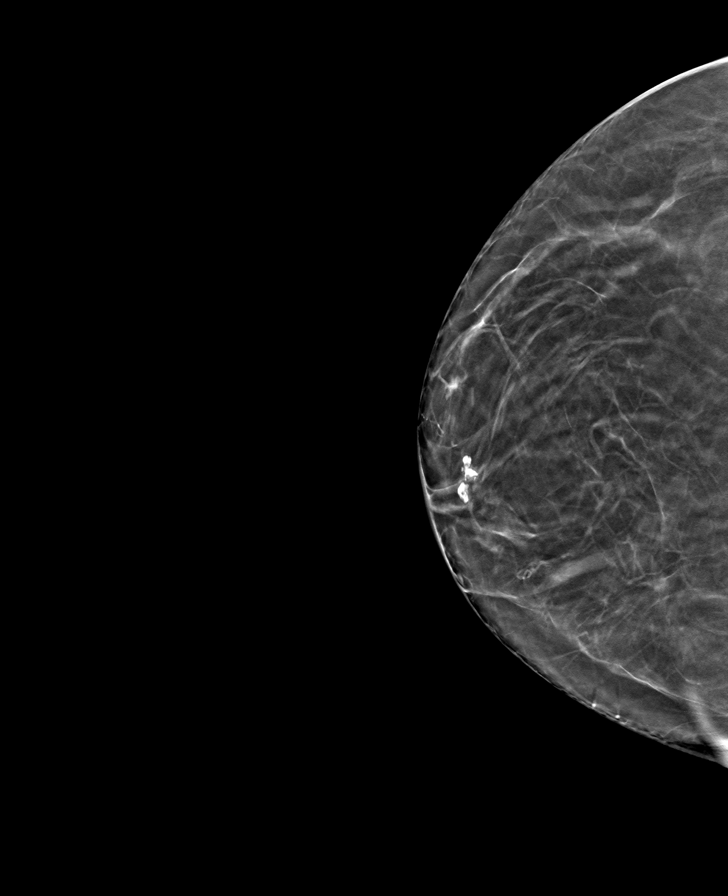

[R MLO tomo · tomo slice 37/72.0]
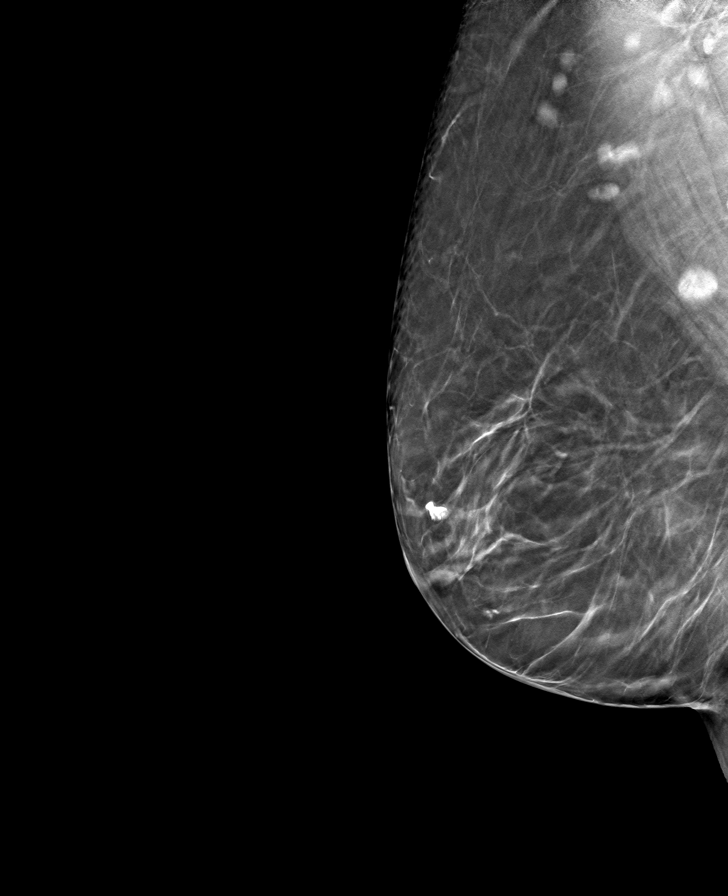

[8 of 24 positions shown; findings below may reference images not displayed]

ACR Breast Density Category b: There are scattered areas of
fibroglandular density.
FINDINGS: In the right breast, a possible asymmetry warrants further
evaluation. In the left breast, no findings suspicious for
malignancy.
IMPRESSION: Further evaluation is suggested for possible asymmetry in the right
breast.

RECOMMENDATION:
Diagnostic mammogram and possibly ultrasound of the right breast.
(Code:BU-[id number])

The patient will be contacted regarding the findings, and additional
imaging will be scheduled.

BI-RADS CATEGORY  0: Incomplete. Need additional imaging evaluation
and/or prior mammograms for comparison.

## 2024-01-22 ENCOUNTER — Telehealth: Payer: Self-pay | Admitting: *Deleted

## 2024-01-22 NOTE — Telephone Encounter (Signed)
 Copied from CRM [id number]. Topic: Appointments - Appointment Scheduling >> Jan 22, 2024  1:37 PM Truddie Crumble wrote: Patient/patient representative is calling to schedule an appointment. Refer to attachments for appointment information.   Patient called to make an appointment for a physical and she wanted to make sure that she gets blood work and an EKG when she comes in on 4/17. Patient would like to be called to set the labs up

## 2024-01-22 NOTE — Telephone Encounter (Signed)
 Please let pt know labs can only be done on day of appt after she sees provider so appropriate labs are ordered.

## 2024-01-29 ENCOUNTER — Ambulatory Visit: Admitting: Physician Assistant

## 2024-01-30 ENCOUNTER — Ambulatory Visit: Admitting: Physician Assistant

## 2024-03-05 NOTE — Progress Notes (Signed)
 Subjective:    Shelly Reynolds is a 68 y.o. female and is here for an annual exam.  HPI Chief Complaint  Patient presents with   Annual Exam   Insomnia    Pt would like a sleep apnea test.    Acute Concerns: Insomnia: Pt reports frequently waking up in the middle of the night and struggles to go back to sleep.  At times she has started her day at 3 am when unable to go back to sleep.  Pt was recently prescribed Trazodone, which she plans to start over the weekend.  She has previously been on CPAP therapy twice, prior to her weight loss when living in [place] .  Both times she stopped CPAP due to difficulty adjusting to the mask.  She would like to have an at home sleep study done for re-evaluation.   Chronic Issues: Osteopenia: Pt had bone density done in 07/2015; she is uncertain of the reason this was done before 68 y/o.  Not currently taking any vitamin D  or calcium supplementation.  Denies any known family history of osteoporosis or Jenne term use of steroids.   Weight management: Compliant with Wegovy with no intolerances or adverse side effects.  Pt follows up with her bariatric specialist, Dr. Lajuana Pilar.  Maintains healthy lifestyle with well-balanced diet and regular exercise.  Stopped taking her bariatric supplements about 3 weeks ago to avoid constipation and other GI issues.    Health Maintenance: Immunizations -- UTD.  Colonoscopy -- UTD, last done 12/17/2016.  Mammogram -- UTD, last done 11/26/2023. Results showed no mammographic evidence of malignancy.  PAP -- Hysterectomy.  Bone Density -- Per EHR, last done 08/09/15. Results showed osteopenia. 2 year repeat was recommended at the time.  Diet -- Overall healthy and well-balanced diet.  Exercise -- Gardening  Sleep habits -- Pt reports insomnia, see above.  Mood -- Stable  UTD with dentist? - Yes UTD with eye doctor? - Yes, just got new glasses.   Weight history: Wt Readings from Last 10 Encounters:   03/06/24 112 lb 4 oz (50.9 kg)  09/05/23 108 lb (49 kg)  07/17/23 108 lb (49 kg)  02/04/23 110 lb (49.9 kg)  10/10/22 106 lb (48.1 kg)  01/02/22 127 lb (57.6 kg)  09/13/21 142 lb (64.4 kg)  08/01/21 149 lb 9.6 oz (67.9 kg)  07/18/21 152 lb (68.9 kg)  07/12/21 146 lb (66.2 kg)   Body mass index is 22.67 kg/m. Patient's last menstrual period was 01/23/2006 (exact date).  Alcohol use:  reports current alcohol use of about 1.0 standard drink of alcohol per week.  Tobacco use:  Tobacco Use: Low Risk  (03/06/2024)   Patient History    Smoking Tobacco Use: Never    Smokeless Tobacco Use: Never    Passive Exposure: Not on file   Eligible for lung cancer screening? no     03/06/2024    9:29 AM  Depression screen PHQ 2/9  Decreased Interest 0  Down, Depressed, Hopeless 0  PHQ - 2 Score 0     Other providers/specialists: Patient Care Team: Alexander Iba, [place] as PCP - General (Physician Assistant)    PMHx, SurgHx, SocialHx, Medications, and Allergies were reviewed in the Visit Navigator and updated as appropriate.   Past Medical History:  Diagnosis Date   Adenomatous colon polyp    Arthritis    Back pain    COVID-19 virus infection 08/2020   Depression    Diverticulosis    Foot pain  GERD (gastroesophageal reflux disease)    hx of   History of blood clot in brain    age 55- removed by sx   History of gastric bypass 2009   Hx of blood clots    Joint pain    Overweight (BMI 25.0-29.9)    Sleep apnea    does not use CPAP now had gastric bypass   Urine incontinence      Past Surgical History:  Procedure Laterality Date   ABDOMINAL HYSTERECTOMY  2011   BLADDER REPAIR     BREAST REDUCTION SURGERY     GASTRIC BYPASS     KNEE SURGERY     LIPOMA EXCISION Left 10/27/2019   Procedure: EXCISION OF LEFT THIGH LIPOMA;  Surgeon: Sim Dryer, MD;  Location: [facility];  Service: General;  Laterality: Left;   REDUCTION MAMMAPLASTY     TONSILLECTOMY  AND ADENOIDECTOMY Bilateral 1962   TUBAL LIGATION     TUMMY TUCK N/A    WISDOM TOOTH EXTRACTION       Family History  Problem Relation Age of Onset   Cancer Mother    Arthritis Mother    Heart disease Mother    Diabetes Mother    Breast cancer Mother        55s   Heart disease Father    Hypertension Father    Sleep apnea Father    Alcohol abuse Brother    Diabetes Brother    Alcohol abuse Maternal Grandmother    Breast cancer Maternal Grandmother    Other Daughter        Overdose   ALS Son    Breast cancer Maternal Aunt 35   Alcohol abuse Maternal Uncle    Colon cancer Neg Hx    Esophageal cancer Neg Hx    Stomach cancer Neg Hx    Rectal cancer Neg Hx     Social History   Tobacco Use   Smoking status: Never   Smokeless tobacco: Never  Vaping Use   Vaping status: Never Used  Substance Use Topics   Alcohol use: Yes    Alcohol/week: 1.0 standard drink of alcohol    Types: 1 Standard drinks or equivalent per week    Comment: OCCASSIONAL   Drug use: No    Review of Systems:   Review of Systems  Constitutional:  Negative for chills, fever, malaise/fatigue and weight loss.  HENT:  Negative for hearing loss, sinus pain and sore throat.   Respiratory:  Negative for cough and hemoptysis.   Cardiovascular:  Negative for chest pain, palpitations, leg swelling and PND.  Gastrointestinal:  Negative for abdominal pain, constipation, diarrhea, heartburn, nausea and vomiting.  Genitourinary:  Negative for dysuria, frequency and urgency.  Musculoskeletal:  Negative for back pain, myalgias and neck pain.  Skin:  Negative for itching and rash.  Neurological:  Negative for dizziness, tingling, seizures and headaches.  Endo/Heme/Allergies:  Negative for polydipsia.  Psychiatric/Behavioral:  Negative for depression. The patient is not nervous/anxious.      Objective:   BP 130/84 (BP Location: Left Arm, Patient Position: Sitting, Cuff Size: Normal)   Pulse 74   Temp 98.1  F (36.7 C) (Temporal)   Ht 4\' 11"  (1.499 m)   Wt 112 lb 4 oz (50.9 kg)   LMP 01/23/2006 (Exact Date)   BMI 22.67 kg/m  Body mass index is 22.67 kg/m.   General Appearance:    Alert, cooperative, no distress, appears stated age  Head:  Normocephalic, without obvious abnormality, atraumatic  Eyes:    PERRL, conjunctiva/corneas clear, EOM's intact, fundi    benign, both eyes  Ears:    Normal TM's and external ear canals, both ears  Nose:   Nares normal, septum midline, mucosa normal, no drainage    or sinus tenderness  Throat:   Lips, mucosa, and tongue normal; teeth and gums normal  Neck:   Supple, symmetrical, trachea midline, no adenopathy;    thyroid:  no enlargement/tenderness/nodules; no carotid   bruit or JVD  Back:     Symmetric, no curvature, ROM normal, no CVA tenderness  Lungs:     Clear to auscultation bilaterally, respirations unlabored  Chest Wall:    No tenderness or deformity   Heart:    Regular rate and rhythm, S1 and S2 normal, no murmur, rub or gallop  Breast Exam:    Deferred  Abdomen:     Soft, non-tender, bowel sounds active all four quadrants,    no masses, no organomegaly  Genitalia:    Deferred  Extremities:   Extremities normal, atraumatic, no cyanosis or edema  Pulses:   2+ and symmetric all extremities  Skin:   Skin color, texture, turgor normal, no rashes or lesions  Lymph nodes:   Cervical, supraclavicular, and axillary nodes normal  Neurologic:   CNII-XII intact, normal strength, sensation and reflexes    throughout    Assessment/Plan:   S/P gastric bypass Doing well with weight maintenance Update blood work to assess for any deficiencies Continue management with [employer]  OSA (obstructive sleep apnea) New referral placed  Vitamin D  deficiency Update and provide recommendations   Osteopenia, unspecified location; Estrogen deficiency Encouraged calcium and vitamin D  but often has difficulty tolerating supplements Update DEXA and  advise accordingly  Elevated glucose Update Hemoglobin A1c and provide recommendations  She is on max Wegovy therapy  Hyperlipidemia, unspecified hyperlipidemia type Update lipid panel and provide recommendations    I, Bernita Bristle, acting as a Neurosurgeon for [employer], [employer]., have documented all relevant documentation on the behalf of Alexander Iba, [place], as directed by   while in the presence of Alexander Iba, PA.  I, Alexander Iba, PA, have reviewed all documentation for this visit. The documentation on 03/06/24 for the exam, diagnosis, procedures, and orders are all accurate and complete.  Alexander Iba, PA-C [facility]

## 2024-03-06 ENCOUNTER — Ambulatory Visit: Payer: Self-pay | Admitting: Physician Assistant

## 2024-03-06 ENCOUNTER — Ambulatory Visit (INDEPENDENT_AMBULATORY_CARE_PROVIDER_SITE_OTHER): Admitting: Physician Assistant

## 2024-03-06 ENCOUNTER — Encounter: Payer: Self-pay | Admitting: Physician Assistant

## 2024-03-06 VITALS — BP 130/84 | HR 74 | Temp 98.1°F | Ht 59.0 in | Wt 112.2 lb

## 2024-03-06 DIAGNOSIS — E785 Hyperlipidemia, unspecified: Secondary | ICD-10-CM | POA: Diagnosis not present

## 2024-03-06 DIAGNOSIS — R7309 Other abnormal glucose: Secondary | ICD-10-CM

## 2024-03-06 DIAGNOSIS — Z9884 Bariatric surgery status: Secondary | ICD-10-CM | POA: Diagnosis not present

## 2024-03-06 DIAGNOSIS — E559 Vitamin D deficiency, unspecified: Secondary | ICD-10-CM | POA: Diagnosis not present

## 2024-03-06 DIAGNOSIS — M8580 Other specified disorders of bone density and structure, unspecified site: Secondary | ICD-10-CM

## 2024-03-06 DIAGNOSIS — G4733 Obstructive sleep apnea (adult) (pediatric): Secondary | ICD-10-CM

## 2024-03-06 DIAGNOSIS — Z1211 Encounter for screening for malignant neoplasm of colon: Secondary | ICD-10-CM

## 2024-03-06 DIAGNOSIS — E2839 Other primary ovarian failure: Secondary | ICD-10-CM

## 2024-03-06 LAB — LIPID PANEL
Cholesterol: 221 mg/dL — ABNORMAL HIGH (ref 0–200)
HDL: 102 mg/dL (ref >39.00)
LDL Cholesterol: 107 mg/dL — ABNORMAL HIGH (ref 0–99)
NonHDL: 118.77
Total CHOL/HDL Ratio: 2
Triglycerides: 60 mg/dL (ref 0.0–149.0)
VLDL: 12 mg/dL (ref 0.0–40.0)

## 2024-03-06 LAB — CBC WITH DIFFERENTIAL/PLATELET
Basophils Absolute: 0 10*3/uL (ref 0.0–0.1)
Basophils Relative: 0.9 % (ref 0.0–3.0)
Eosinophils Absolute: 0 10*3/uL (ref 0.0–0.7)
Eosinophils Relative: 0.8 % (ref 0.0–5.0)
HCT: 42.8 % (ref 36.0–46.0)
Hemoglobin: 14.4 g/dL (ref 12.0–15.0)
Lymphocytes Relative: 29.6 % (ref 12.0–46.0)
Lymphs Abs: 1.6 10*3/uL (ref 0.7–4.0)
MCHC: 33.6 g/dL (ref 30.0–36.0)
MCV: 90 fl (ref 78.0–100.0)
Monocytes Absolute: 0.3 10*3/uL (ref 0.1–1.0)
Monocytes Relative: 6.3 % (ref 3.0–12.0)
Neutro Abs: 3.3 10*3/uL (ref 1.4–7.7)
Neutrophils Relative %: 62.4 % (ref 43.0–77.0)
Platelets: 257 10*3/uL (ref 150.0–400.0)
RBC: 4.75 Mil/uL (ref 3.87–5.11)
RDW: 13.5 % (ref 11.5–15.5)
WBC: 5.3 10*3/uL (ref 4.0–10.5)

## 2024-03-06 LAB — IBC + FERRITIN
Ferritin: 14.7 ng/mL (ref 10.0–291.0)
Iron: 81 ug/dL (ref 42–145)
Saturation Ratios: 17.7 % — ABNORMAL LOW (ref 20.0–50.0)
TIBC: 456.4 ug/dL — ABNORMAL HIGH (ref 250.0–450.0)
Transferrin: 326 mg/dL (ref 212.0–360.0)

## 2024-03-06 LAB — COMPREHENSIVE METABOLIC PANEL WITH GFR
ALT: 12 U/L (ref 0–35)
AST: 17 U/L (ref 0–37)
Albumin: 4.5 g/dL (ref 3.5–5.2)
Alkaline Phosphatase: 77 U/L (ref 39–117)
BUN: 23 mg/dL (ref 6–23)
CO2: 29 meq/L (ref 19–32)
Calcium: 9.2 mg/dL (ref 8.4–10.5)
Chloride: 103 meq/L (ref 96–112)
Creatinine, Ser: 0.67 mg/dL (ref 0.40–1.20)
GFR: 90.91 mL/min (ref >60.00)
Glucose, Bld: 79 mg/dL (ref 70–99)
Potassium: 4.4 meq/L (ref 3.5–5.1)
Sodium: 140 meq/L (ref 135–145)
Total Bilirubin: 0.6 mg/dL (ref 0.2–1.2)
Total Protein: 6.7 g/dL (ref 6.0–8.3)

## 2024-03-06 LAB — HEMOGLOBIN A1C: Hgb A1c MFr Bld: 5.6 % (ref 4.6–6.5)

## 2024-03-06 LAB — VITAMIN D 25 HYDROXY (VIT D DEFICIENCY, FRACTURES): VITD: 27.12 ng/mL — ABNORMAL LOW (ref 30.00–100.00)

## 2024-03-06 LAB — VITAMIN B12: Vitamin B-12: 197 pg/mL — ABNORMAL LOW (ref 211–911)

## 2024-03-06 NOTE — Patient Instructions (Addendum)
 It was great to see you!  -Referral to pulmonology for obstructive sleep apnea evaluation -Please ask our front desk staff to help you schedule your bone density scan -Please consider addressing your hearing loss -Try to remember sunscreen!  Please go to the lab for blood work.   Our office will call you with your results unless you have chosen to receive results via MyChart.  If your blood work is normal we will follow-up each year for physicals and as scheduled for chronic medical problems.  If anything is abnormal we will treat accordingly and get you in for a follow-up.  Take care,  Mozell Haber

## 2024-03-27 ENCOUNTER — Telehealth: Payer: Self-pay | Admitting: *Deleted

## 2024-03-27 ENCOUNTER — Encounter: Payer: Self-pay | Admitting: Gastroenterology

## 2024-03-27 NOTE — Telephone Encounter (Signed)
 Left detailed message on personal voicemail calling about referrals. The Bone Density test should of be scheduled here on you wait out from your appt. I will send a message to the front desk schedulers to call you to schedule. The other referrals have been sent. Please contact Pulmonary at [phone] for sleep study test and [facility] at [phone] for Colonoscopy. Any problems please call office.

## 2024-03-27 NOTE — Telephone Encounter (Signed)
 Please call patient to schedule Bone Density test. Orders are I Epic.

## 2024-03-27 NOTE — Telephone Encounter (Signed)
 Copied from CRM [id number]. Topic: Referral - Status >> Mar 26, 2024  9:06 AM Howard Macho wrote: Reason for CRM: patient called stating she has not heard from anyone regarding the bone density test, sleep apnea and colonoscopy CB [phone]

## 2024-03-30 NOTE — Telephone Encounter (Signed)
 Spoke to pt. Will call back & ask for main office to schedule Bone Density appt.

## 2024-04-27 ENCOUNTER — Ambulatory Visit (INDEPENDENT_AMBULATORY_CARE_PROVIDER_SITE_OTHER)
Admission: RE | Admit: 2024-04-27 | Discharge: 2024-04-27 | Disposition: A | Discharge status: Home / Self Care | Source: Ambulatory Visit | Attending: Physician Assistant

## 2024-04-27 DIAGNOSIS — E2839 Other primary ovarian failure: Secondary | ICD-10-CM

## 2024-05-25 ENCOUNTER — Ambulatory Visit (AMBULATORY_SURGERY_CENTER)

## 2024-05-25 VITALS — Ht 59.0 in | Wt 110.0 lb

## 2024-05-25 DIAGNOSIS — Z860100 Personal history of colon polyps, unspecified: Secondary | ICD-10-CM

## 2024-05-25 MED ORDER — NA SULFATE-K SULFATE-MG SULF 17.5-3.13-1.6 GM/177ML PO SOLN: 1.0000 | Freq: Once | ORAL | 0 refills | Status: AC

## 2024-05-25 NOTE — Progress Notes (Signed)
 No egg or soy allergy known to patient  No issues known to pt with past sedation with any surgeries or procedures Patient denies ever being told they had issues or difficulty with intubation  No FH of Malignant Hyperthermia Pt is not on diet pills Pt is not on  home 02  Pt is not on blood thinners  Pt has some issues with constipation and takes stool softner No A fib or A flutter Have any cardiac testing pending--no Pt can ambulate independently Pt denies use of chewing tobacco Discussed diabetic I weight loss medication holds Discussed NSAID holds Checked BMI Pt instructed to use Singlecare.com or GoodRx for a price reduction on prep  Patient's chart reviewed by Norleen Schillings CNRA prior to previsit and patient appropriate for the [facility].  Pre visit completed and red dot placed by patient's name on their procedure day (on provider's schedule).

## 2024-05-29 ENCOUNTER — Ambulatory Visit (INDEPENDENT_AMBULATORY_CARE_PROVIDER_SITE_OTHER)

## 2024-05-29 ENCOUNTER — Ambulatory Visit: Admitting: Internal Medicine

## 2024-05-29 ENCOUNTER — Other Ambulatory Visit: Payer: Self-pay | Admitting: Pharmacist

## 2024-05-29 VITALS — BP 128/74 | HR 80 | Temp 97.8°F | Ht 58.5 in | Wt 114.2 lb

## 2024-05-29 DIAGNOSIS — K5900 Constipation, unspecified: Secondary | ICD-10-CM | POA: Diagnosis not present

## 2024-05-29 DIAGNOSIS — G4733 Obstructive sleep apnea (adult) (pediatric): Secondary | ICD-10-CM

## 2024-05-29 DIAGNOSIS — G2581 Restless legs syndrome: Secondary | ICD-10-CM

## 2024-05-29 DIAGNOSIS — G4700 Insomnia, unspecified: Secondary | ICD-10-CM

## 2024-05-29 DIAGNOSIS — E611 Iron deficiency: Secondary | ICD-10-CM

## 2024-05-29 DIAGNOSIS — F5101 Primary insomnia: Secondary | ICD-10-CM

## 2024-05-29 MED ORDER — RAMELTEON 8 MG PO TABS: 8.0000 mg | ORAL_TABLET | Freq: Every day | ORAL | 0 refills | Status: DC

## 2024-05-29 MED ORDER — FERUMOXYTOL INJECTION 510 MG/17 ML: 510.0000 mg | Freq: Once | INTRAVENOUS | 0 refills | Status: DC

## 2024-05-29 MED ORDER — SODIUM CHLORIDE 0.9 % IV SOLN: 1000.0000 mg | Freq: Once | INTRAVENOUS | Status: DC

## 2024-05-29 NOTE — Patient Instructions (Signed)
 Iron infusion to treat iron deficiency.   In lab sleep study for OSA diagnosis.   Miralax twice daily for constipation. If not working then add senna twice daily.

## 2024-05-29 NOTE — Progress Notes (Addendum)
 New Patient Pulmonology Office Visit   Subjective:  Patient ID: Shelly Reynolds, female    DOB: May 24, 1956  MRN: [id number]  Referred by: Job Lukes, [place]  CC:  Chief Complaint  Patient presents with   Consult    Sleep consult Had a sleep study done roughly 10 years ago in [place] .  Pt no longer has her CPAP.     HPI Shelly Reynolds is a 68 y.o. female with gastric bypass in 2009 for weight loss, OSA on CPAP prior to weight loss, GERD, depression and back pain. Presents due to concern for sleep apnea.  Denies being depressed. But has poor duration of sleep.  Was prescribed trazodone at night to help with sleep. Did not like being groggy with it afterwards. Last used months ago. Prescribed phentermine  but not taking it.   Works in [employer] as Conservation officer, nature.   Labs: May 2025 CO2 29, ferritin 15, TIBC 456.   Mouth breather: yes Preferred sleeping position: side.   Sleep related Symptoms:  Snoring- yes Witnessed apnea- yes Gasping/choking- yes morning HA/dry mouth- yes tired on awakening, excessive daytime sleepiness- no Restless legs- yes. Usually at night around 9 pm. Is non veg. Was supposed to be on iron supplement.  Hypnogogic hallucination- no sleep paralysis- no sleep walking- no  sleep talking- no  night terrors/mares- no dream enactment- no  Sleep routine: son and brother have similar low sleep duration. Has learnt to cope with it over years.  -Bed: 7-9 pm.  -Nocturnal awakenings: falls asleep around 1 am.  -Wake: every 1-2 hour and can take 1 hour to fall asleep. Final awakening 4-5 am. Without alarm.  -Napping:no -perceived sleep time: 3 hours -sleep hygiene: reads on phone or book.   Habits: -Caffeine: 1/2 cup in morning.  -Alcohol: no -Nicotine:no -Recreational drugs:  no  -Exercise: house work.   PRIOR TESTS and IMAGING: PSG/HSAT: In lab sleep study >10 years ago in [place]. Dx as sleep apnea. Not sure how bad it was.   ECHO: none on file.    MRI Brain: none.       No data to display          Allergies: Patient has no known allergies.  Current Outpatient Medications:    ramelteon  (ROZEREM ) 8 MG tablet, Take 1 tablet (8 mg total) by mouth at bedtime., Disp: 30 tablet, Rfl: 0   WEGOVY 2.4 MG/0.75ML SOAJ, Inject 2.4 mg into the skin once a week., Disp: , Rfl:    phentermine  (ADIPEX-P ) 37.5 MG tablet, Take 37.5 mg by mouth every morning. 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    age 45- removed by sx   History of gastric bypass 2009   Hx of blood clots    Joint pain    Overweight (BMI 25.0-29.9)    Sleep apnea    does not use CPAP now had gastric bypass   Urine incontinence    Past Surgical History:  Procedure Laterality Date   ABDOMINAL HYSTERECTOMY  2011   BLADDER REPAIR     BREAST REDUCTION SURGERY     BREAST  REDUCTION SURGERY     GASTRIC BYPASS     KNEE SURGERY     LIPOMA EXCISION Left 10/27/2019   Procedure: EXCISION OF LEFT THIGH LIPOMA;  Surgeon: Vanderbilt Ned, MD;  Location:  SURGERY CENTER;  Service: General;  Laterality: Left;   REDUCTION MAMMAPLASTY     TONSILLECTOMY AND ADENOIDECTOMY Bilateral 1962   TUBAL LIGATION     TUMMY TUCK N/A    WISDOM TOOTH EXTRACTION     Family History  Problem Relation Age of Onset   Cancer Mother    Arthritis Mother    Heart disease Mother    Diabetes Mother    Breast cancer Mother        32s   Heart disease Father    Hypertension Father    Sleep apnea Father    Colon polyps Brother    Colon cancer Brother    Alcohol abuse Brother    Diabetes Brother    Breast cancer Maternal Aunt 35   Alcohol abuse Maternal Uncle    Alcohol abuse Maternal Grandmother     Breast cancer Maternal Grandmother    Other Daughter        Overdose   ALS Son    Esophageal cancer Neg Hx    Stomach cancer Neg Hx    Rectal cancer Neg Hx    Social History   Socioeconomic History   Marital status: Widowed    Spouse name: Not on file   Number of children: 5   Years of education: Not on file   Highest education level: Not on file  Occupational History   Occupation: retired   Occupation: Conservation officer, nature at school  Tobacco Use   Smoking status: Never   Smokeless tobacco: Never  Vaping Use   Vaping status: Never Used  Substance and Sexual Activity   Alcohol use: Not Currently    Alcohol/week: 1.0 standard drink of alcohol    Types: 1 Standard drinks or equivalent per week   Drug use: No   Sexual activity: Yes    Comment: HYSTERECTOMY  Other Topics Concern   Not on file  Social History Narrative   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   Husband passed away from lung cancer 2023/07/08   Has disabled son   Social Drivers of Corporate investment banker Strain: Not on file  Food Insecurity: Not on file  Transportation Needs: Not on file  Physical Activity: Sufficiently Active (09/05/2023)   Exercise Vital Sign    Days of Exercise per Week: 5 days    Minutes of Exercise per Session: 60 min  Stress: No Stress Concern Present (09/05/2023)   [employer] of Occupational Health - Occupational Stress Questionnaire    Feeling of Stress : Not at all  Social Connections: Moderately Isolated (09/05/2023)   Social Connection and Isolation Panel    Frequency of Communication with Friends and Family: More than three times a week    Frequency of Social Gatherings with Friends and Family: More than three times a week    Attends Religious Services: 1 to 4 times per year    Active Member of [employer] or Organizations: No    Attends Banker Meetings: Never    Marital Status: Widowed  Intimate Partner Violence: Not At Risk (09/05/2023)   Humiliation, Afraid, Rape,  and Kick questionnaire    Fear of Current or Ex-Partner: No    Emotionally Abused: No    Physically Abused: No    Sexually  Abused: No       Objective:  BP 128/74   Pulse 80   Temp 97.8 F (36.6 C)   Ht 4' 10.5 (1.486 m)   Wt 114 lb 3.2 oz (51.8 kg)   LMP 01/23/2006 (Exact Date)   SpO2 100% Comment: RA  BMI 23.46 kg/m  BMI Readings from Last 3 Encounters:  05/29/24 23.46 kg/m  05/25/24 22.22 kg/m  03/06/24 22.67 kg/m    Physical Exam: CONSTITUTIONAL: NAD, well-appearing NASAL/OROPHARYNX:  Normal mucosa. No septal deviation. No hypertrophy of inferior turbinates. Modified Mallampati score 2.  CV: RRR s1s2 nl, no murmurs  RESP: Clear to auscultation, normal respiratory effort   NEURO: CN II/XII grossly intact PSYCH: Alert & oriented x 3, Euthymic, appropriate affect SKIN: clear on face with no sig rash or acne MUSCULOSKELETAL:  Normal gait   Diagnostic Review:  Last metabolic panel Lab Results  Component Value Date   GLUCOSE 79 03/06/2024   NA 140 03/06/2024   K 4.4 03/06/2024   CL 103 03/06/2024   CO2 29 03/06/2024   BUN 23 03/06/2024   CREATININE 0.67 03/06/2024   GFR 90.91 03/06/2024   CALCIUM 9.2 03/06/2024   PROT 6.7 03/06/2024   ALBUMIN 4.5 03/06/2024   BILITOT 0.6 03/06/2024   ALKPHOS 77 03/06/2024   AST 17 03/06/2024   ALT 12 03/06/2024   ANIONGAP 9 10/23/2019        Assessment & Plan:   Assessment & Plan OSA (obstructive sleep apnea) In lab sleep study to evaluate if she is having paradoxical sleep and also OSA. Advised to bring rozerem  to sleep lab for helping with sleep.  Cont w side sleep.  Primary insomnia Poor sleep duration sleep apnea. Not sure if there is paradoxical sleep component. Will dc trazodone as it was making her groggy after waking up. Will try shorter acting sleep aid in the form of rozerem .  Iron deficiency Contributing to RLS.  Will give feraheme x 1. Reached out to pharmacy to help schedule appointment with infusion  center.  Constipation, unspecified constipation type Miralax BID OTC . If this does not help add senna BID as well.   Orders Placed This Encounter  Procedures   Polysomnography 4 or more parameters    She was counselled about not driving while drowsy which is common side effect of sleep related disorders.   Return for Follow up after scheduling for iron infusion. . 2 weeks after.   Laquan Ludden, MD

## 2024-05-29 NOTE — Addendum Note (Signed)
 Addended by: Aireana Ryland D on: 05/29/2024 10:33 AM   Modules accepted: Orders

## 2024-05-29 NOTE — Progress Notes (Signed)
 Therapy plan placed for [facility] [phone]) [place] to start benefits investigation. Patient has history of gastric bypass and baseline constipation  Diagnosis: iron deficiency contributing to RLS  Provider: Dr. Rahul Pawar  Dose: 510mg  at Day 0 then Day 7 (or after) Premedications: none  Last Clinic Visit: 05/29/2024  Pertinent Labs:   Serum ferritin 03/06/2024 - 14.7 Transferrin ratio: 17.7% Renal function wnl  Sherry Pennant, PharmD, MPH, BCPS, CPP Clinical Pharmacist (Rheumatology and Pulmonology)

## 2024-06-01 ENCOUNTER — Telehealth: Payer: Self-pay

## 2024-06-01 DIAGNOSIS — E611 Iron deficiency: Secondary | ICD-10-CM | POA: Insufficient documentation

## 2024-06-01 DIAGNOSIS — D509 Iron deficiency anemia, unspecified: Secondary | ICD-10-CM | POA: Insufficient documentation

## 2024-06-01 DIAGNOSIS — G2581 Restless legs syndrome: Secondary | ICD-10-CM | POA: Insufficient documentation

## 2024-06-01 NOTE — Telephone Encounter (Signed)
 Auth Submission: NO AUTH NEEDED Site of care: Site of care: [facility] Payer: medicare a/b, [place] [employer] Medication & CPT/J Code(s) submitted: Feraheme (ferumoxytol ) [id number] Diagnosis Code:  Route of submission (phone, fax, portal): portal Phone # Fax # Auth type: Buy/Bill HB Units/visits requested: 510mg  x 2 doses Reference number:  Approval from: 06/01/24 to 10/14/24

## 2024-06-02 NOTE — Progress Notes (Signed)
 Feraheme scheduled for 06/09/2024

## 2024-06-05 ENCOUNTER — Encounter: Admitting: Gastroenterology

## 2024-06-05 ENCOUNTER — Ambulatory Visit: Admitting: Gastroenterology

## 2024-06-05 ENCOUNTER — Encounter: Payer: Self-pay | Admitting: Gastroenterology

## 2024-06-05 VITALS — BP 116/80 | HR 64 | Temp 97.9°F | Resp 13 | Ht 59.0 in | Wt 110.0 lb

## 2024-06-05 DIAGNOSIS — Z860101 Personal history of adenomatous and serrated colon polyps: Secondary | ICD-10-CM

## 2024-06-05 DIAGNOSIS — D122 Benign neoplasm of ascending colon: Secondary | ICD-10-CM

## 2024-06-05 DIAGNOSIS — Z1211 Encounter for screening for malignant neoplasm of colon: Secondary | ICD-10-CM

## 2024-06-05 DIAGNOSIS — D123 Benign neoplasm of transverse colon: Secondary | ICD-10-CM

## 2024-06-05 DIAGNOSIS — K644 Residual hemorrhoidal skin tags: Secondary | ICD-10-CM

## 2024-06-05 DIAGNOSIS — Z860100 Personal history of colon polyps, unspecified: Secondary | ICD-10-CM

## 2024-06-05 DIAGNOSIS — K5730 Diverticulosis of large intestine without perforation or abscess without bleeding: Secondary | ICD-10-CM | POA: Diagnosis not present

## 2024-06-05 DIAGNOSIS — K648 Other hemorrhoids: Secondary | ICD-10-CM

## 2024-06-05 MED ORDER — SODIUM CHLORIDE 0.9 % IV SOLN: 500.0000 mL | Freq: Once | INTRAVENOUS | Status: DC

## 2024-06-05 MED ORDER — SUCRALFATE 1 G PO TABS: 1.0000 g | ORAL_TABLET | Freq: Two times a day (BID) | ORAL | 3 refills | Status: DC | PRN

## 2024-06-05 NOTE — Progress Notes (Signed)
 [facility] History and Physical   Primary Care Physician:  Job Lukes, [place]   Reason for Procedure:  History of adenomatous colon polyps  Plan:    Surveillance colonoscopy with possible interventions as needed     HPI: Shelly Reynolds is a very pleasant 68 y.o. female here for surveillance colonoscopy. Denies any nausea, vomiting, abdominal pain, melena or bright red blood per rectum  The risks and benefits as well as alternatives of endoscopic procedure(s) have been discussed and reviewed. All questions answered. The patient agrees to proceed.    Past Medical History:  Diagnosis Date   Adenomatous colon polyp    Allergy    Arthritis    Back pain    COVID-19 virus infection 08/2020   Depression    Diverticulosis    Foot pain    GERD (gastroesophageal reflux disease)    hx of   History of blood clot in brain    age 65- removed by sx   History of gastric bypass 2009   Hx of blood clots    Joint pain    Overweight (BMI 25.0-29.9)    Sleep apnea    does not use CPAP now had gastric bypass   Urine incontinence     Past Surgical History:  Procedure Laterality Date   ABDOMINAL HYSTERECTOMY  2011   BLADDER REPAIR     BREAST REDUCTION SURGERY     BREAST REDUCTION SURGERY     COLONOSCOPY     GASTRIC BYPASS     KNEE SURGERY     LIPOMA EXCISION Left 10/27/2019   Procedure: EXCISION OF LEFT THIGH LIPOMA;  Surgeon: Vanderbilt Ned, MD;  Location: [facility];  Service: General;  Laterality: Left;   REDUCTION MAMMAPLASTY     TONSILLECTOMY AND ADENOIDECTOMY Bilateral 1962   TUBAL LIGATION     TUMMY TUCK N/A    WISDOM TOOTH EXTRACTION      Prior to Admission medications   Medication Sig Start Date End Date Taking? Authorizing Provider  ramelteon  (ROZEREM ) 8 MG tablet Take 1 tablet (8 mg total) by mouth at bedtime. 05/29/24  Yes Pawar, Rahul, MD  phentermine  (ADIPEX-P ) 37.5 MG tablet Take 37.5 mg by mouth every morning. Patient not taking:  No sig reported 08/15/22   [provider]  WEGOVY 2.4 MG/0.75ML SOAJ Inject 2.4 mg into the skin once a week.    [provider]    Current Outpatient Medications  Medication Sig Dispense Refill   ramelteon  (ROZEREM ) 8 MG tablet Take 1 tablet (8 mg total) by mouth at bedtime. 30 tablet 0   phentermine  (ADIPEX-P ) 37.5 MG tablet Take 37.5 mg by mouth every morning. (Patient not taking: No sig reported)     WEGOVY 2.4 MG/0.75ML SOAJ Inject 2.4 mg into the skin once a week.     Current Facility-Administered Medications  Medication Dose Route Frequency Provider Last Rate Last Admin   0.9 %  sodium chloride  infusion  500 mL Intravenous Once Quadarius Henton V, MD        Allergies as of 06/05/2024   (No Known Allergies)    Family History  Problem Relation Age of Onset   Cancer Mother    Arthritis Mother    Heart disease Mother    Diabetes Mother    Breast cancer Mother        51s   Heart disease Father    Hypertension Father    Sleep apnea Father    Colon polyps Brother  Colon cancer Brother    Alcohol abuse Brother    Diabetes Brother    Breast cancer Maternal Aunt 35   Alcohol abuse Maternal Uncle    Alcohol abuse Maternal Grandmother    Breast cancer Maternal Grandmother    Other Daughter        Overdose   ALS Son    Esophageal cancer Neg Hx    Stomach cancer Neg Hx    Rectal cancer Neg Hx     Social History   Socioeconomic History   Marital status: Widowed    Spouse name: Not on file   Number of children: 5   Years of education: Not on file   Highest education level: Not on file  Occupational History   Occupation: retired   Occupation: Conservation officer, nature at school  Tobacco Use   Smoking status: Never   Smokeless tobacco: Never  Vaping Use   Vaping status: Never Used  Substance and Sexual Activity   Alcohol use: Not Currently    Alcohol/week: 1.0 standard drink of alcohol    Types: 1 Standard drinks or equivalent per week   Drug use: No    Sexual activity: Yes    Birth control/protection: Surgical, Post-menopausal    Comment: HYSTERECTOMY  Other Topics Concern   Not on file  Social History Narrative   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   Husband passed away from lung cancer 06-25-2023   Has disabled son   Social Drivers of Corporate investment banker Strain: Not on file  Food Insecurity: Not on file  Transportation Needs: Not on file  Physical Activity: Sufficiently Active (09/05/2023)   Exercise Vital Sign    Days of Exercise per Week: 5 days    Minutes of Exercise per Session: 60 min  Stress: No Stress Concern Present (09/05/2023)   [employer] of Occupational Health - Occupational Stress Questionnaire    Feeling of Stress : Not at all  Social Connections: Moderately Isolated (09/05/2023)   Social Connection and Isolation Panel    Frequency of Communication with Friends and Family: More than three times a week    Frequency of Social Gatherings with Friends and Family: More than three times a week    Attends Religious Services: 1 to 4 times per year    Active Member of [employer] or Organizations: No    Attends Banker Meetings: Never    Marital Status: Widowed  Intimate Partner Violence: Not At Risk (09/05/2023)   Humiliation, Afraid, Rape, and Kick questionnaire    Fear of Current or Ex-Partner: No    Emotionally Abused: No    Physically Abused: No    Sexually Abused: No    Review of Systems:  All other review of systems negative except as mentioned in the HPI.  Physical Exam: Vital signs in last 24 hours: BP 110/75   Pulse 68   Temp 97.9 F (36.6 C) (Temporal)   Ht 4' 11 (1.499 m)   Wt 110 lb (49.9 kg)   LMP 01/23/2006 (Exact Date)   SpO2 100%   BMI 22.22 kg/m  General:   Alert, NAD Lungs:  Clear .   Heart:  Regular rate and rhythm Abdomen:  Soft, nontender and nondistended. Neuro/Psych:  Alert and cooperative. Normal mood and affect. A and O x 3  Reviewed labs, radiology  imaging, old records and pertinent past GI work up  Patient is appropriate for planned procedure(s) and anesthesia in an ambulatory setting   K.  Veena Davanta Meuser , MD [phone]

## 2024-06-05 NOTE — Progress Notes (Signed)
 Called to room to assist during endoscopic procedure.  Patient ID and intended procedure confirmed with present staff. Received instructions for my participation in the procedure from the performing physician.

## 2024-06-05 NOTE — Op Note (Signed)
 [facility] Patient Name: Shelly Reynolds Procedure Date: 06/05/2024 11:34 AM MRN: [id number] Endoscopist: Gustav ALONSO Mcgee , MD, [id number] Age: 68 Referring MD:  Date of Birth: 02-05-1956 Gender: Female Account #: [id number] Procedure:                Colonoscopy Indications:              High risk colon cancer surveillance: Personal                            history of colonic polyps, Last colonoscopy: 2018 Medicines:                Monitored Anesthesia Care Procedure:                Pre-Anesthesia Assessment:                           - Prior to the procedure, a History and Physical                            was performed, and patient medications and                            allergies were reviewed. The patient's tolerance of                            previous anesthesia was also reviewed. The risks                            and benefits of the procedure and the sedation                            options and risks were discussed with the patient.                            All questions were answered, and informed consent                            was obtained. Prior Anticoagulants: The patient has                            taken no anticoagulant or antiplatelet agents. ASA                            Grade Assessment: II - A patient with mild systemic                            disease. After reviewing the risks and benefits,                            the patient was deemed in satisfactory condition to                            undergo the procedure.  After obtaining informed consent, the colonoscope                            was passed under direct vision. Throughout the                            procedure, the patient's blood pressure, pulse, and                            oxygen saturations were monitored continuously. The                            [id number]-HQ190L Colonoscope [id number] was introduced                            through  the anus and advanced to the the cecum,                            identified by appendiceal orifice and ileocecal                            valve. The colonoscopy was performed without                            difficulty. The patient tolerated the procedure                            well. The quality of the bowel preparation was                            good. The ileocecal valve, appendiceal orifice, and                            rectum were photographed. Scope In: 11:51:15 AM Scope Out: 12:06:54 PM Scope Withdrawal Time: 0 hours 11 minutes 0 seconds  Total Procedure Duration: 0 hours 15 minutes 39 seconds  Findings:                 The perianal and digital rectal examinations were                            normal.                           Four sessile polyps were found in the transverse                            colon and ascending colon. The polyps were 3 to 10                            mm in size. These polyps were removed with a cold                            snare. Resection and retrieval were complete.  A few small-mouthed diverticula were found in the                            sigmoid colon.                           Non-bleeding external and internal hemorrhoids were                            found during retroflexion. The hemorrhoids were                            medium-sized. Complications:            No immediate complications. Estimated Blood Loss:     Estimated blood loss was minimal. Impression:               - Four 3 to 10 mm polyps in the transverse colon                            and in the ascending colon, removed with a cold                            snare. Resected and retrieved.                           - Diverticulosis in the sigmoid colon.                           - Non-bleeding external and internal hemorrhoids. Recommendation:           - Patient has a contact number available for                             emergencies. The signs and symptoms of potential                            delayed complications were discussed with the                            patient. Return to normal activities tomorrow.                            Written discharge instructions were provided to the                            patient.                           - Resume previous diet.                           - Continue present medications.                           - Await pathology results.                           -  Repeat colonoscopy in 3 years for surveillance                            based on pathology results. Devery Murgia V. Sanika Brosious, MD 06/05/2024 12:13:04 PM This report has been signed electronically.

## 2024-06-05 NOTE — Patient Instructions (Signed)

## 2024-06-05 NOTE — Progress Notes (Signed)
 Pt sedate, gd SR's, VSS, report to RN

## 2024-06-05 NOTE — Progress Notes (Signed)
 Pt's states no medical or surgical changes since previsit or office visit.

## 2024-06-08 ENCOUNTER — Telehealth: Payer: Self-pay

## 2024-06-08 NOTE — Telephone Encounter (Signed)
  Follow up Call-     06/05/2024   10:47 AM  Call back number  Post procedure Call Back phone  # [phone]  Permission to leave phone message Yes     Patient questions:  Do you have a fever, pain , or abdominal swelling? No. Pain Score  0 *  Have you tolerated food without any problems? Yes.    Have you been able to return to your normal activities? Yes.    Do you have any questions about your discharge instructions: Diet   No. Medications  No. Follow up visit  No.  Do you have questions or concerns about your Care? No.  Actions: * If pain score is 4 or above: No action needed, pain <4.

## 2024-06-09 ENCOUNTER — Encounter: Admitting: Emergency Medicine

## 2024-06-09 VITALS — BP 116/74 | HR 70 | Temp 97.9°F | Resp 17

## 2024-06-09 DIAGNOSIS — G2581 Restless legs syndrome: Secondary | ICD-10-CM | POA: Diagnosis present

## 2024-06-09 DIAGNOSIS — E611 Iron deficiency: Secondary | ICD-10-CM | POA: Diagnosis present

## 2024-06-09 DIAGNOSIS — D509 Iron deficiency anemia, unspecified: Secondary | ICD-10-CM | POA: Insufficient documentation

## 2024-06-09 LAB — SURGICAL PATHOLOGY

## 2024-06-09 MED ORDER — DIPHENHYDRAMINE HCL 25 MG PO CAPS
25.0000 mg | ORAL_CAPSULE | Freq: Once | ORAL | Status: AC
  Administered 2024-06-09: 25 mg via ORAL

## 2024-06-09 MED ORDER — ACETAMINOPHEN 325 MG PO TABS
650.0000 mg | ORAL_TABLET | Freq: Once | ORAL | Status: AC
  Administered 2024-06-09: 650 mg via ORAL

## 2024-06-09 MED ORDER — SODIUM CHLORIDE 0.9 % IV SOLN
510.0000 mg | Freq: Once | INTRAVENOUS | Status: AC
  Administered 2024-06-09: 510 mg via INTRAVENOUS
  Filled 2024-06-09: qty 17

## 2024-06-09 NOTE — Progress Notes (Signed)
 Diagnosis: Iron Deficiency Anemia  Provider:  Sammi Fredericks MD  Procedure: IV Infusion  IV Type: Peripheral, IV Location: R Antecubital  Feraheme (Ferumoxytol ), Dose: 510 mg  Infusion Start Time: 1451  Infusion Stop Time: 1509  Post Infusion IV Care: Observation period completed and Peripheral IV Discontinued  Discharge: Condition: Good, Destination: Home . AVS Declined  Performed by:  Delon ONEIDA Officer, RN

## 2024-06-10 ENCOUNTER — Telehealth: Payer: Self-pay

## 2024-06-10 NOTE — Telephone Encounter (Signed)
 Called patient. Patient was driving at the moment so unable to schedule appointment. Advised patient that appointment needs to be 4 weeks from today. Patient will call back when she has access to her calendar.

## 2024-06-10 NOTE — Addendum Note (Signed)
 Addended by: DAYNE SHERRY RAMAN on: 06/10/2024 02:44 PM   Modules accepted: Orders

## 2024-06-11 NOTE — Telephone Encounter (Signed)
 Attempted to call patient. Left voicemail for patient to call back to get scheduled in September.

## 2024-06-12 NOTE — Telephone Encounter (Signed)
 Patient scheduled.

## 2024-06-16 ENCOUNTER — Encounter: Admitting: Internal Medicine

## 2024-06-16 VITALS — BP 121/79 | HR 67 | Temp 98.0°F | Resp 14

## 2024-06-16 DIAGNOSIS — E611 Iron deficiency: Secondary | ICD-10-CM | POA: Diagnosis present

## 2024-06-16 DIAGNOSIS — G2581 Restless legs syndrome: Secondary | ICD-10-CM | POA: Diagnosis present

## 2024-06-16 DIAGNOSIS — D509 Iron deficiency anemia, unspecified: Secondary | ICD-10-CM | POA: Diagnosis present

## 2024-06-16 MED ORDER — DIPHENHYDRAMINE HCL 25 MG PO CAPS
25.0000 mg | ORAL_CAPSULE | Freq: Once | ORAL | Status: AC
  Administered 2024-06-16: 25 mg via ORAL

## 2024-06-16 MED ORDER — ACETAMINOPHEN 325 MG PO TABS
650.0000 mg | ORAL_TABLET | Freq: Once | ORAL | Status: AC
  Administered 2024-06-16: 650 mg via ORAL

## 2024-06-16 MED ORDER — SODIUM CHLORIDE 0.9 % IV SOLN
510.0000 mg | Freq: Once | INTRAVENOUS | Status: AC
  Administered 2024-06-16: 510 mg via INTRAVENOUS
  Filled 2024-06-16: qty 17

## 2024-06-16 NOTE — Progress Notes (Signed)
 Diagnosis: Iron Deficiency Anemia  Provider:  Pawar,Rahul,MD  Procedure: IV Infusion  IV Type: Peripheral, IV Location: R Antecubital  Feraheme (Ferumoxytol ), Dose: 510 mg  Infusion Start Time: 1332  Infusion Stop Time: 1350  Post Infusion IV Care: Observation period completed  Discharge: Condition: Good, Destination: Home . AVS Provided  Performed by:  Blanca Selinda SAUNDERS, LPN

## 2024-06-16 NOTE — Addendum Note (Signed)
 Addended by: BLANCA SELINDA SAUNDERS on: 06/16/2024 02:37 PM   Modules accepted: Orders

## 2024-06-23 ENCOUNTER — Other Ambulatory Visit: Payer: Self-pay

## 2024-06-23 ENCOUNTER — Telehealth: Payer: Self-pay

## 2024-06-23 DIAGNOSIS — G4733 Obstructive sleep apnea (adult) (pediatric): Secondary | ICD-10-CM

## 2024-06-23 NOTE — Telephone Encounter (Signed)
 Patient states that she is not able to do an in lab sleep study due to circumstances at home and requested a sleep study to be done at home instead. Can we cancel the in lab and make an order for home sleep study? Pt also asked that she get a phone call back in regards to this but after 12:30 as she is at work until then.

## 2024-06-29 NOTE — Telephone Encounter (Signed)
 Thank you. I will cancel the other order

## 2024-06-29 NOTE — Telephone Encounter (Signed)
 Changed to HST from in lab PSG at the request of the patient as she will be unable to come in person anytime soon.

## 2024-06-29 NOTE — Telephone Encounter (Signed)
 Hey there I am not able to make orders or changes to them so a new referral will have to be made. Thank you!

## 2024-07-08 ENCOUNTER — Ambulatory Visit

## 2024-07-14 ENCOUNTER — Encounter

## 2024-07-14 DIAGNOSIS — G4733 Obstructive sleep apnea (adult) (pediatric): Secondary | ICD-10-CM

## 2024-07-15 NOTE — Progress Notes (Addendum)
 Patient cancelled several upcoming appointments including f/u with Dr. Pawar. Will route or message scheduling team for attempt to reschedule.   Shelly Reynolds, PharmD, BCPS Clinical Pharmacist  [facility]

## 2024-07-24 DIAGNOSIS — G4733 Obstructive sleep apnea (adult) (pediatric): Secondary | ICD-10-CM | POA: Diagnosis not present

## 2024-07-30 ENCOUNTER — Telehealth: Payer: Self-pay

## 2024-07-30 ENCOUNTER — Ambulatory Visit: Payer: Self-pay

## 2024-07-30 DIAGNOSIS — G4733 Obstructive sleep apnea (adult) (pediatric): Secondary | ICD-10-CM

## 2024-07-30 NOTE — Telephone Encounter (Signed)
 Copied from CRM [id number]. Topic: Clinical - Lab/Test Results >> Jul 27, 2024  1:47 PM Celestine FALCON wrote: Reason for CRM: Pt had a home sleep study completed on 07/14/2024 and wanted the results of the sleep study. Pt has a follow up in Nov to review, but wanted the results before hand for her other provider. I let her know based on the order it shows it should be ready by 07/27/2024 at 226pm. Pt's phone number [phone] ok to leave a vm.  Please advise results

## 2024-07-31 NOTE — Progress Notes (Signed)
 Called and spoke to pt - advised of sleep study results per Dr. Theodoro. Pt verbalized understanding, CPAP order placed. NFN.

## 2024-08-12 ENCOUNTER — Encounter (HOSPITAL_BASED_OUTPATIENT_CLINIC_OR_DEPARTMENT_OTHER): Admitting: Pulmonary Disease

## 2024-08-12 ENCOUNTER — Ambulatory Visit: Payer: Self-pay | Admitting: Gastroenterology

## 2024-08-26 ENCOUNTER — Ambulatory Visit (INDEPENDENT_AMBULATORY_CARE_PROVIDER_SITE_OTHER)

## 2024-08-26 VITALS — BP 110/76 | HR 74 | Ht <= 58 in | Wt 115.2 lb

## 2024-08-26 DIAGNOSIS — G4733 Obstructive sleep apnea (adult) (pediatric): Secondary | ICD-10-CM

## 2024-08-26 DIAGNOSIS — F5101 Primary insomnia: Secondary | ICD-10-CM

## 2024-08-26 DIAGNOSIS — G2581 Restless legs syndrome: Secondary | ICD-10-CM | POA: Diagnosis not present

## 2024-08-26 DIAGNOSIS — E611 Iron deficiency: Secondary | ICD-10-CM

## 2024-08-26 NOTE — Progress Notes (Signed)
 New Patient Pulmonology Office Visit   Subjective:  Patient ID: Shelly Reynolds, female    DOB: [birth year]  MRN: [id number]  Referred by: No ref. provider found  CC:  Chief Complaint  Patient presents with   Follow-up    Sleep study and infusions f/u Pt states that the infusions helped her sleep, and she does not think that she will pursue the CPAP.     HPI [name] is a 68 y.o. female with gastric bypass in 2009 for weight loss, OSA on CPAP prior to weight loss, GERD, depression and back pain.  OSA history: In lab sleep study >10 years ago in [place]. Dx as sleep apnea. Not sure how bad it was.  In-lab sleep study ordered for paradoxical sleep> changed to HST pt request>  HST done 07/14/2024: AHI 40, 4% 36, O2 nadir 74% desaturation 6-minute.  V-shaped desaturation seen on oximetry Presents due to concern for sleep apnea.   Labs: May 2025 CO2 29, ferritin 15, TIBC 456.   05/29/2024: HST ordered. RLS symptoms>Ferritin 15, Trans sat 18%>iron infusion completed 06/16/24.   Discussed the use of AI scribe software for clinical note transcription with the patient, who gave verbal consent to proceed.  History of Present Illness   Shelly Reynolds is a 68 year old female with restless leg syndrome and obstructive sleep apnea who presents for follow-up.  She has experienced significant improvement in her restless leg syndrome symptoms, noting the absence of leg movements while trying to sleep. She reports that the infusion really helped her restless leg syndrome. Despite this, her sleep remains suboptimal, with occasional nights of insomnia, although she generally falls back asleep quickly if she wakes up during the night.  A recent home sleep test indicated severe obstructive sleep apnea. She was prescribed CPAP therapy in the past but discontinued its use due to discomfort and improper fit. She has noticed recent weight gain, which she believes may be contributing to her symptoms, although  her BMI is within the normal range. She does not snore when she loses weight. Currently, she is not using CPAP and has not contacted the company for a new machine.  Her sleep pattern includes going to bed at 7 PM, reading until 10 PM, and waking up multiple times during the night. She typically wakes up at 3 AM and does not take naps during the day. Despite frequent awakenings, she does not feel tired and maintains a busy schedule, working from 5:30 AM to 12:30 PM and spending time in her garden until dinner. She is receiving B12 shots, which she was informed would increase her energy levels, although she already feels energetic and busy throughout the day.            08/26/2024    2:00 PM 05/29/2024   11:00 AM  Results of the Epworth flowsheet  Sitting and reading 0 0  Watching TV 0 0  Sitting, inactive in a public place (e.g. a theatre or a meeting) 0 0  As a passenger in a car for an hour without a break 0 0  Lying down to rest in the afternoon when circumstances permit 0 0  Sitting and talking to someone 0 0  Sitting quietly after a lunch without alcohol 0 0  In a car, while stopped for a few minutes in traffic 0 0  Total score 0 0    Allergies: Patient has no known allergies.  Current Outpatient Medications:    cyanocobalamin  (  VITAMIN B12) 1000 MCG/ML injection, Inject 1,000 mcg into the skin once a week., Disp: , Rfl:    Vitamin D , Ergocalciferol , (DRISDOL) 1.25 MG (50000 UNIT) CAPS capsule, Take 50,000 Units by mouth once a week., Disp: , Rfl:    WEGOVY 2.4 MG/0.75ML SOAJ, Inject 2.4 mg into the skin once a week. (Patient taking differently: Inject 2.4 mg into the skin every 30 (thirty) days.), Disp: , Rfl:    phentermine  (ADIPEX-P ) 37.5 MG tablet, Take 37.5 mg by mouth every morning. (Patient not taking: Reported on 08/26/2024), Disp: , Rfl:    ramelteon  (ROZEREM ) 8 MG tablet, TAKE 1 TABLET BY MOUTH AT BEDTIME (Patient not taking: Reported on 08/26/2024), Disp: 30 tablet, Rfl:  0   sucralfate  (CARAFATE ) 1 g tablet, Take 1 tablet (1 g total) by mouth 2 (two) times daily as needed. 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    age 63- removed by sx   History of gastric bypass 2009   Hx of blood clots    Joint pain    Overweight (BMI 25.0-29.9)    Sleep apnea    does not use CPAP now had gastric bypass   Urine incontinence    Past Surgical History:  Procedure Laterality Date   ABDOMINAL HYSTERECTOMY  2011   BLADDER REPAIR     BREAST REDUCTION SURGERY     BREAST REDUCTION SURGERY     COLONOSCOPY     GASTRIC BYPASS     KNEE SURGERY     LIPOMA EXCISION Left 10/27/2019   Procedure: EXCISION OF LEFT THIGH LIPOMA;  Surgeon: Vanderbilt Ned, MD;  Location: [facility];  Service: General;  Laterality: Left;   REDUCTION MAMMAPLASTY     TONSILLECTOMY AND ADENOIDECTOMY Bilateral 1962   TUBAL LIGATION     TUMMY TUCK N/A    WISDOM TOOTH EXTRACTION     Family History  Problem Relation Age of Onset   Cancer Mother    Arthritis Mother    Heart disease Mother    Diabetes Mother    Breast cancer Mother        39s   Heart disease Father    Hypertension Father    Sleep apnea Father    Colon polyps Brother    Colon cancer Brother    Alcohol abuse Brother    Diabetes Brother    Breast cancer Maternal Aunt 35   Alcohol abuse Maternal Uncle    Alcohol abuse Maternal Grandmother    Breast cancer Maternal Grandmother    Other Daughter        Overdose   ALS Son    Esophageal cancer Neg Hx    Stomach cancer Neg Hx    Rectal cancer Neg Hx    Social History   Socioeconomic History   Marital status: Widowed    Spouse name: Not on file   Number of  children: 5   Years of education: Not on file   Highest education level: Not on file  Occupational History   Occupation: retired   Occupation: conservation officer, nature at school  Tobacco Use   Smoking  status: Never   Smokeless tobacco: Never  Vaping Use   Vaping status: Never Used  Substance and Sexual Activity   Alcohol use: Not Currently    Alcohol/week: 1.0 standard drink of alcohol    Types: 1 Standard drinks or equivalent per week   Drug use: No   Sexual activity: Yes    Birth control/protection: Surgical, Post-menopausal    Comment: HYSTERECTOMY  Other Topics Concern   Not on file  Social History Narrative   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   Husband passed away from lung cancer September 29, 2023   Has disabled son   Social Drivers of Corporate Investment Banker Strain: Not on file  Food Insecurity: Not on file  Transportation Needs: Not on file  Physical Activity: Sufficiently Active (09/05/2023)   Exercise Vital Sign    Days of Exercise per Week: 5 days    Minutes of Exercise per Session: 60 min  Stress: No Stress Concern Present (09/05/2023)   [employer] of Occupational Health - Occupational Stress Questionnaire    Feeling of Stress : Not at all  Social Connections: Moderately Isolated (09/05/2023)   Social Connection and Isolation Panel    Frequency of Communication with Friends and Family: More than three times a week    Frequency of Social Gatherings with Friends and Family: More than three times a week    Attends Religious Services: 1 to 4 times per year    Active Member of [employer] or Organizations: No    Attends Banker Meetings: Never    Marital Status: Widowed  Intimate Partner Violence: Not At Risk (09/05/2023)   Humiliation, Afraid, Rape, and Kick questionnaire    Fear of Current or Ex-Partner: No    Emotionally Abused: No    Physically Abused: No    Sexually Abused: No       Objective:  BP 110/76   Pulse 74   Ht 4' 10 (1.473 m) Comment: Per  pt  Wt 115 lb 3.2 oz (52.3 kg)   LMP 01/23/2006 (Exact Date)   SpO2 95% Comment: RA  BMI 24.08 kg/m  BMI Readings from Last 3 Encounters:  08/26/24 24.08 kg/m  06/05/24 22.22 kg/m  05/29/24 23.46 kg/m    Physical Exam: Physical Exam   MEASUREMENTS: BMI- 24.0. ENT: Normal mucosa. No hypertrophy of inferior turbinates. Tonsils are normal sized. Modified Mallampati score is normal. PULMONARY: Lungs clear to auscultation bilaterally, no adventitious breath sounds. CARDIOVASCULAR: Regular rate and rhythm, S1 S2 normal, no murmurs. ABDOMEN: Abdomen soft, nontender. Bowel sounds are normal EXTREMITIES: No peripheral edema noted.       Diagnostic Review:  Last metabolic panel Lab Results  Component Value Date   GLUCOSE 79 03/06/2024   NA 140 03/06/2024   K 4.4 03/06/2024   CL 103 03/06/2024   CO2 29 03/06/2024   BUN 23 03/06/2024   CREATININE 0.67 03/06/2024   GFR 90.91 03/06/2024   CALCIUM 9.2 03/06/2024   PROT 6.7 03/06/2024   ALBUMIN 4.5 03/06/2024   BILITOT 0.6 03/06/2024   ALKPHOS 77 03/06/2024   AST 17 03/06/2024   ALT 12 03/06/2024   ANIONGAP 9 10/23/2019        Assessment & Plan:   Assessment & Plan OSA (obstructive sleep apnea)  Iron deficiency  Restless leg syndrome  Primary insomnia   Assessment and Plan    Obstructive sleep apnea Severe obstructive sleep apnea confirmed. CPAP remains primary treatment. Inspire implant discussed but declined. Emphasized CPAP  adherence for insurance and sleep quality improvement. - will coordinate with [employer] for CPAP setup. - Recommended hybrid mask for mouth breathing. - Advised gradual CPAP acclimatization during non-sleep activities. - Instructed to maintain side sleeping position. - Educated on insurance requirements: 5 days/week, 4 hours/night for 3 months.  Insomnia Frequent awakenings likely related to obstructive sleep apnea. No significant daytime fatigue. B12 supplementation ongoing, no neuropathy.  Discussed potential sleep quality improvement with CPAP. - Encouraged CPAP use to improve sleep quality and reduce awakenings.   RLS: Iron Deficiency Anemia:  - Symptoms resolved.  Will recheck iron studies if has recurrence of symptoms.      No orders of the defined types were placed in this encounter.   She was counselled about not driving while drowsy which is common side effect of sleep related disorders.   Return for 1 month after getting CPAP. .   I personally spent a total of 25 minutes in the care of the patient today including preparing to see the patient, getting/reviewing separately obtained history, performing a medically appropriate exam/evaluation, counseling and educating, placing orders, and documenting clinical information in the EHR.   Micha Erck, MD

## 2024-08-26 NOTE — Patient Instructions (Signed)
  VISIT SUMMARY: Today, we discussed your restless leg syndrome and obstructive sleep apnea. You reported significant improvement in your restless leg syndrome symptoms, but your sleep remains suboptimal with occasional insomnia. We reviewed your recent home sleep test results, which confirmed severe obstructive sleep apnea, and discussed the importance of CPAP therapy.  YOUR PLAN: -OBSTRUCTIVE SLEEP APNEA: Obstructive sleep apnea is a condition where your airway becomes blocked during sleep, causing breathing pauses. We discussed the importance of using CPAP therapy to improve your sleep quality and meet insurance requirements. We coordinated with [employer] for your CPAP setup and recommended a hybrid mask for mouth breathing. You should gradually get used to the CPAP by wearing it during non-sleep activities and try to sleep on your side. Remember, insurance requires you to use the CPAP for at least 5 days a week, 4 hours a night, for 3 months.  -INSOMNIA: Insomnia is difficulty falling or staying asleep. Your frequent awakenings are likely related to your obstructive sleep apnea. We encouraged you to use the CPAP to improve your sleep quality and reduce awakenings. Your B12 supplementation will continue as it is not causing any issues.  INSTRUCTIONS: Please follow up with [employer] to set up your CPAP machine and start using it as recommended. If you have any issues with the CPAP mask or machine, contact the company for assistance. Continue with your B12 shots as prescribed.                       Contains text generated by [employer]bridge.                                 Contains text generated by Abridge.

## 2024-09-28 ENCOUNTER — Encounter: Payer: Self-pay | Admitting: Family Medicine

## 2024-09-28 ENCOUNTER — Ambulatory Visit: Admitting: Family Medicine

## 2024-09-28 VITALS — BP 122/74 | HR 76 | Ht <= 58 in | Wt 127.6 lb

## 2024-09-28 DIAGNOSIS — E663 Overweight: Secondary | ICD-10-CM | POA: Diagnosis not present

## 2024-09-28 DIAGNOSIS — Z8639 Personal history of other endocrine, nutritional and metabolic disease: Secondary | ICD-10-CM | POA: Diagnosis not present

## 2024-09-28 DIAGNOSIS — F4329 Adjustment disorder with other symptoms: Secondary | ICD-10-CM | POA: Diagnosis not present

## 2024-09-28 DIAGNOSIS — Z6826 Body mass index (BMI) 26.0-26.9, adult: Secondary | ICD-10-CM | POA: Diagnosis not present

## 2024-09-28 DIAGNOSIS — G4733 Obstructive sleep apnea (adult) (pediatric): Secondary | ICD-10-CM

## 2024-09-28 DIAGNOSIS — R632 Polyphagia: Secondary | ICD-10-CM | POA: Diagnosis not present

## 2024-09-28 DIAGNOSIS — E611 Iron deficiency: Secondary | ICD-10-CM | POA: Diagnosis not present

## 2024-09-28 DIAGNOSIS — Z9884 Bariatric surgery status: Secondary | ICD-10-CM | POA: Diagnosis not present

## 2024-09-28 MED ORDER — TIRZEPATIDE-WEIGHT MANAGEMENT 10 MG/0.5ML ~~LOC~~ SOLN: 10.0000 mg | SUBCUTANEOUS | 1 refills | Status: DC

## 2024-09-28 NOTE — Progress Notes (Unsigned)
° ° °  Diagnoses and Orders:   1. OSA (obstructive sleep apnea)    Meds ordered this encounter  Medications   tirzepatide  10 MG/0.5ML injection vial    Sig: Inject 10 mg into the skin once a week.    Dispense:  2 mL    Refill:  1   No orders of the defined types were placed in this encounter.  Assessment & Plan:   Assessment and Plan Assessment & Plan Obstructive sleep apnea, severe Severe obstructive sleep apnea confirmed. CPAP therapy not tolerated. Inspire therapy declined due to invasiveness concerns. - Avoid CPAP therapy. - Defer Inspire therapy evaluation.  Obesity Obesity management complicated by sleep apnea. Wegovy discontinued, leading to weight gain. Interested in Zepbound  for weight management and potential sleep apnea benefits. Awaiting Medicare coverage for weight loss medications. - Initiated Zepbound  at 10 mg, potential increase to 15 mg. - Monitor weight and sleep apnea symptoms.    Geni Shutter, DO, MS, FAAFP, Dipl. [facility] [address] [place], [address] Dept: [phone] Dept Fax: [phone]  Subjective:   History of Present Illness Shelly Reynolds is a 68 year old female with severe sleep apnea who presents with weight gain and difficulty managing her condition.  Weight gain and appetite dysregulation - Significant weight gain attributed to inconsistent use of Wegovy 2.4 mg - Irregular dosing of Wegovy - Perception of weight and eating being out of control  Sleep apnea symptoms and management - Recent sleep study confirms severe sleep apnea - Daily morning headaches - Nocturnal awakenings - Marked dry mouth requiring water at bedside - Inconsistent CPAP use due to discomfort and frequent tossing and turning - Evaluated for Inspire device but hesitant to pursue due to invasiveness  Sleep quality and iron deficiency - Recent iron infusion has markedly improved ability to fall asleep and  return to sleep after nighttime awakenings  Bereavement and emotional well-being - Husband died one year ago - Continues to grieve her loss - Has not pursued therapy - Feels emotionally supported by family  Review of Systems: Negative, with the exception of above mentioned in HPI.  Medications:   Show/hide medication list[1]  Objective:   BP 122/74 (BP Location: Right Arm, Cuff Size: Normal)   Pulse 76   Ht 4' 10 (1.473 m)   Wt 127 lb 9.6 oz (57.9 kg)   LMP 01/23/2006   SpO2 99%   BMI 26.67 kg/m   Physical Exam  Attestations:   {EW NEW PT ATTESTATIONS:34266}    [1]  Outpatient Medications Prior to Visit  Medication Sig   cyanocobalamin  (VITAMIN B12) 1000 MCG/ML injection Inject 1,000 mcg into the skin once a week.   Vitamin D , Ergocalciferol , (DRISDOL) 1.25 MG (50000 UNIT) CAPS capsule Take 50,000 Units by mouth once a week.   [DISCONTINUED] WEGOVY 2.4 MG/0.75ML SOAJ Inject 2.4 mg into the skin once a week.   phentermine  (ADIPEX-P ) 37.5 MG tablet Take 37.5 mg by mouth every morning. (Patient not taking: Reported on 09/28/2024)   [DISCONTINUED] ramelteon  (ROZEREM ) 8 MG tablet TAKE 1 TABLET BY MOUTH AT BEDTIME (Patient not taking: Reported on 08/26/2024)   [DISCONTINUED] sucralfate  (CARAFATE ) 1 g tablet Take 1 tablet (1 g total) by mouth 2 (two) times daily as needed. (Patient not taking: Reported on 08/26/2024)   No facility-administered medications prior to visit.

## 2024-09-29 ENCOUNTER — Encounter: Admitting: Family Medicine

## 2024-10-09 ENCOUNTER — Telehealth: Payer: Self-pay

## 2024-10-09 DIAGNOSIS — G4733 Obstructive sleep apnea (adult) (pediatric): Secondary | ICD-10-CM

## 2024-10-09 DIAGNOSIS — E663 Overweight: Secondary | ICD-10-CM

## 2024-10-09 NOTE — Telephone Encounter (Signed)
 Received a message from pharmacy:  Patient can't afford cost $449.00  She would wants to see if you will call in the pens and get a  PA done.   It might be cheaper? Please review and advise.  Thanks.  Dm/cma

## 2024-10-14 NOTE — Telephone Encounter (Signed)
 Patient Zepbound  vial is needed for the pen.   Dr. Prentiss may you please send in the patient Zepbound  Vial for her Pen

## 2024-10-14 NOTE — Telephone Encounter (Signed)
 Copied from CRM #[id number]. Topic: Clinical - Prescription Issue >> Oct 13, 2024 11:29 AM Shelly Reynolds wrote: Reason for CRM: tirzepatide  10 MG/0.5ML injection vial need it for the pen instead of the vial

## 2024-10-30 NOTE — Telephone Encounter (Signed)
 I called and spoke with Teah. The prescription that was sent back in December was sent as vials when it should have been pens. She had a sleep study done and the results are in her chart. She wanted to remind us  of this so that it could be documented in her OV note incase a PA is needed. I have changed the script to Zepbound  10mg  pens, not vials. It is ready to be sent if appropriate. Thank you. Pt can not answer her phone until after 1230PM most days..If we have to call her for any reason.

## 2024-11-02 MED ORDER — ZEPBOUND 10 MG/0.5ML ~~LOC~~ SOAJ: 10.0000 mg | SUBCUTANEOUS | 3 refills | Status: DC

## 2024-11-02 NOTE — Addendum Note (Signed)
 Addended by: PRENTISS FRIEZE R on: 11/02/2024 07:22 PM   Modules accepted: Orders

## 2024-11-03 ENCOUNTER — Other Ambulatory Visit: Payer: Self-pay | Admitting: Family Medicine

## 2024-11-03 DIAGNOSIS — Z1231 Encounter for screening mammogram for malignant neoplasm of breast: Secondary | ICD-10-CM

## 2024-11-05 ENCOUNTER — Telehealth: Payer: Self-pay

## 2024-11-05 NOTE — Telephone Encounter (Signed)
 I have called pts pharmacy to see what the issue is. I spoke with a pharmacy associate and was told that the prescription is needing a prior authorization. I will send pt a message to let her know this. Sending this message to our PA team to get this started.

## 2024-11-05 NOTE — Telephone Encounter (Signed)
 Copied from CRM #[id number]. Topic: Clinical - Prescription Issue >> Oct 13, 2024 11:29 AM Thersia BROCKS wrote: Reason for CRM: tirzepatide  10 MG/0.5ML injection vial need it for the pen instead of the vial >> Nov 05, 2024  3:38 PM Drema MATSU wrote: Pt is calling to follow up on RX. She was told that if she wasn't contacted by Tuesday to call and follow up. She is requesting a callback from [facility]. She wants to know if it was sent to insurance. Pt is requesting a callback after 1:00 pm tomorrow.

## 2024-11-06 ENCOUNTER — Other Ambulatory Visit (HOSPITAL_COMMUNITY): Payer: Self-pay

## 2024-11-06 ENCOUNTER — Telehealth: Payer: Self-pay

## 2024-11-06 NOTE — Telephone Encounter (Signed)
 Noted.

## 2024-11-06 NOTE — Telephone Encounter (Signed)
 Pharmacy Patient Advocate Encounter   Received notification from Pt Calls Messages that prior authorization for Zepbound  2.5mg /0.47ml is required/requested.   Insurance verification completed.   The patient is insured through [facility].   Per test claim: PA required; PA submitted to above mentioned insurance via Latent Key/confirmation #/EOC [id number] Status is pending

## 2024-11-06 NOTE — Telephone Encounter (Signed)
 Additional information has been requested from the patient's insurance in order to proceed with the prior authorization request. Requested information has been sent, or form has been filled out and faxed back to [phone]  Case: [id number]

## 2024-11-09 NOTE — Telephone Encounter (Signed)
 Pharmacy Patient Advocate Encounter  Received notification from [facility] that Prior Authorization for Zepbound  2.5mg /0.26ml has been APPROVED from 11/08/24 to 11/08/25   PA #/Case ID/Reference #: [id number]

## 2024-11-17 ENCOUNTER — Telehealth: Payer: Self-pay

## 2024-11-17 DIAGNOSIS — G4733 Obstructive sleep apnea (adult) (pediatric): Secondary | ICD-10-CM

## 2024-11-17 MED ORDER — ZEPBOUND 10 MG/0.5ML ~~LOC~~ SOAJ: 10.0000 mg | SUBCUTANEOUS | 0 refills | Status: AC

## 2024-11-17 NOTE — Telephone Encounter (Signed)
 Copied from CRM [id number]. Topic: Clinical - Medication Question >> Nov 17, 2024  1:57 PM Carlyon D wrote: Reason for CRM: Pt is asking if she can get a new script for the zepbound  10 MG as a 3 month script as her insurance will cover it then. Please reach out to pt for anything further.

## 2024-11-23 ENCOUNTER — Ambulatory Visit: Admitting: Family Medicine

## 2024-11-26 ENCOUNTER — Ambulatory Visit

## 2024-12-28 ENCOUNTER — Ambulatory Visit: Admitting: Family Medicine

## 2025-01-12 ENCOUNTER — Ambulatory Visit

## 2025-03-31 ENCOUNTER — Ambulatory Visit: Admitting: Physician Assistant
